# Patient Record
Sex: Female | Born: 1975 | Race: White | Hispanic: No | Marital: Married | Smoking: Current every day smoker
Health system: Southern US, Community
[De-identification: ages and names within clinical notes are randomized; demographics above are authoritative.]

## PROBLEM LIST (undated history)

## (undated) DIAGNOSIS — M25469 Effusion, unspecified knee: Secondary | ICD-10-CM

## (undated) DIAGNOSIS — M546 Pain in thoracic spine: Secondary | ICD-10-CM

## (undated) DIAGNOSIS — T8859XA Other complications of anesthesia, initial encounter: Secondary | ICD-10-CM

## (undated) DIAGNOSIS — L501 Idiopathic urticaria: Secondary | ICD-10-CM

## (undated) DIAGNOSIS — J45909 Unspecified asthma, uncomplicated: Secondary | ICD-10-CM

## (undated) HISTORY — PX: AUGMENTATION MAMMAPLASTY: SUR837

## (undated) HISTORY — PX: TUBAL LIGATION: SHX77

## (undated) HISTORY — PX: ABDOMINAL HYSTERECTOMY: SHX81

---

## 2006-02-22 ENCOUNTER — Emergency Department: Payer: Self-pay | Admitting: Emergency Medicine

## 2006-02-23 ENCOUNTER — Ambulatory Visit: Payer: Self-pay | Admitting: Emergency Medicine

## 2007-01-12 ENCOUNTER — Emergency Department: Payer: Self-pay | Admitting: Emergency Medicine

## 2007-08-15 ENCOUNTER — Emergency Department: Payer: Self-pay | Admitting: Emergency Medicine

## 2007-10-26 ENCOUNTER — Emergency Department: Payer: Self-pay | Admitting: Emergency Medicine

## 2011-03-20 ENCOUNTER — Emergency Department: Payer: Self-pay | Admitting: *Deleted

## 2013-05-31 ENCOUNTER — Emergency Department: Payer: Self-pay | Admitting: Emergency Medicine

## 2014-08-20 ENCOUNTER — Ambulatory Visit: Payer: Self-pay | Admitting: Obstetrics and Gynecology

## 2014-08-28 ENCOUNTER — Ambulatory Visit
Admit: 2014-08-28 | Disposition: A | Discharge status: Home / Self Care | Payer: Self-pay | Attending: Obstetrics and Gynecology | Admitting: Obstetrics and Gynecology

## 2014-08-28 LAB — BASIC METABOLIC PANEL
ANION GAP: 8 (ref 7–16)
BUN: 13 mg/dL
CALCIUM: 8.1 mg/dL — AB
CO2: 21 mmol/L — AB
CREATININE: 0.89 mg/dL
Chloride: 102 mmol/L
Glucose: 158 mg/dL — ABNORMAL HIGH
Potassium: 4.1 mmol/L
Sodium: 131 mmol/L — ABNORMAL LOW

## 2014-08-28 LAB — CBC
HCT: 36.1 % (ref 35.0–47.0)
HGB: 12.3 g/dL (ref 12.0–16.0)
MCH: 30.9 pg (ref 26.0–34.0)
MCHC: 34 g/dL (ref 32.0–36.0)
MCV: 91 fL (ref 80–100)
Platelet: 208 10*3/uL (ref 150–440)
RBC: 3.97 10*6/uL (ref 3.80–5.20)
RDW: 12.5 % (ref 11.5–14.5)
WBC: 11.9 10*3/uL — ABNORMAL HIGH (ref 3.6–11.0)

## 2014-08-29 LAB — BASIC METABOLIC PANEL
ANION GAP: 7 (ref 7–16)
BUN: 10 mg/dL
CO2: 26 mmol/L
CREATININE: 0.77 mg/dL
Calcium, Total: 8.4 mg/dL — ABNORMAL LOW
Chloride: 104 mmol/L
GLUCOSE: 132 mg/dL — AB
Potassium: 4.2 mmol/L
Sodium: 137 mmol/L

## 2014-08-29 LAB — CBC
HCT: 32.6 % — ABNORMAL LOW (ref 35.0–47.0)
HGB: 10.8 g/dL — AB (ref 12.0–16.0)
MCH: 30.1 pg (ref 26.0–34.0)
MCHC: 33 g/dL (ref 32.0–36.0)
MCV: 91 fL (ref 80–100)
PLATELETS: 201 10*3/uL (ref 150–440)
RBC: 3.58 10*6/uL — ABNORMAL LOW (ref 3.80–5.20)
RDW: 12.3 % (ref 11.5–14.5)
WBC: 11.5 10*3/uL — AB (ref 3.6–11.0)

## 2014-09-22 LAB — SURGICAL PATHOLOGY

## 2014-09-28 NOTE — Op Note (Signed)
PATIENT NAME:  Paula Petersen, Paula Petersen MR#:  [id number] DATE OF BIRTH:  17-Aug-1975  DATE OF PROCEDURE:  08/28/2014  PREOPERATIVE DIAGNOSES:  1.  Stress urinary incontinence.  2.  Midline cystocele, grade 2.  3.  Chronic pelvic pain.  4.  Adenomyosis.  POSTOPERATIVE DIAGNOSES:   1.  Stress urinary incontinence.  2.  Midline cystocele, grade 2.  3.  Chronic pelvic pain.  4.  Adenomyosis.  PROCEDURES:  1.  Total vaginal hysterectomy.  2.  Anterior colporrhaphy.  3.  Retropubic tension-free vaginal tape sling.  4.  Cystoscopy.   ANESTHESIA: General.   SURGEON: Conard Novak, MD  ASSISTANT: McPherson Bing, MD  ESTIMATED BLOOD LOSS: 400 mL   OPERATIVE FLUIDS: Crystalloid 1700 mL.   COMPLICATIONS: None.   SPECIMENS: Uterus and cervix.   CONDITION AT THE END OF PROCEDURE: Stable.   PROCEDURE IN DETAIL: The patient was taken to the operating room where a formal timeout was performed. General anesthesia was induced. She was then prepped and draped in the usual sterile fashion and carefully placed in high lithotomy position in candy cane stirrups. Great care was taken to reduce and minimize risk to vulnerable nerves and blood vessels. Exam under anesthesia was performed and revealed the findings as described above. An indwelling catheter was placed in the bladder. The cervix was grasped with a double-tooth tenaculum and circumferentially injected with approximately 10 mL of 0.25% Marcaine with epinephrine for a liquid tourniquet. The cervix was then circumferentially scored with a Bovie. The posterior cul-de-sacs were entered sharply without incident. Deaver was used to displace the bladder as much as possible out of the operative field and right angle retractor was placed to displace the rectum.   Using successive clamp, cut, and tie technique, the uterosacral and cardinal ligaments were transected and suture ligated with 0 Vicryl stitches. The anterior cul-de-sac was eventually entered  after dissecting carefully the bladder off the anterior portion of the uterus and lower uterine segment and cervix with noted adhesive disease. Verification that no damage to the bladder was performed. Uterine vessels and the remaining parametrial tissues were similarly clamped, cut, and tied to the level of the utero-ovarian ligaments bilaterally. The utero-ovarian ligaments secured, transected, ligated with 0 Vicryl sutures and using an initial tie on a passer for the first stitch and the second stitch was a Heaney fixation stitch to ensure hemostasis. The uterus was then removed and handed off the operative field. All pedicles were reinspected and found to be hemostatic.   The anterior vaginal epithelium was then grasped with Allis clamps at the level of the vaginal cuff and a midline incision was made using Metzenbaum scissors and the epithelium was dissected up off the vaginal muscularis. Prior this, the tissue was injected with a dilute solution of vasopressin with 20 units of vasopressin in 40 mL of normal saline for approximately 10 mL in the anterior vaginal wall with good blanching of the tissue.   A weighted speculum was placed in the vagina and the bladder was drained and the Foley catheter was removed. Allis clamps were then placed on either side of the mid urethra and 10 mL were infiltrated on either side of the urethra with a dilute solution of 20 units of vasopressin in 40 mL of normal saline. The mid urethral incision was made and the bilateral periurethral spaces were developed with Metzenbaum scissors. With the bladder empty and urethra deflected to the contralateral side, the TVT needle was advanced through the endopelvic fascia,  space of Retzius, and the abdominal wall to the premarked spot. The same procedure was then carried out on the contralateral side. Care was taken to avoid bladder injury and cystoscopy with the trocars in place showed the absence of bladder penetration.   After the  needles had been had been drawn through the abdominal wall and the tape was adjusted so that it rested underneath the midportion of the urethra in a tension-free manner, the plastic sleeves were carefully removed while the suburethral portion of the sling was stabilized. Good hemostasis was achieved at the vaginal and suprapubic incision sites. The suprapubic incisions were closed with 4-0 Monocryl and Dermabond and approximately 10 mL of 0.25% Marcaine was injected into the suprapubic area.   At this point, the epithelium had already been dissected off the vaginal muscularis on the anterior wall of the cystocele. Once this was completed, the midline structures were then plicated using 2-0 PDS sutures. An appropriate amount of epithelium was trimmed and the vaginal wall including the opening for the TVT was closed with running 3-0 Vicryl sutures with good hemostasis. Note, that there separate incisions for the TVT vaginal wall opening as well as the anterior colporrhaphy opening. This was closed down to the level of the vaginal cuff.   The vaginal cuff was closed in a horizontal manner using #0 Vicryl in a running locked fashion with care to incorporate the uterosacral ligaments as well as the vaginal epithelium and the peritoneum on each edge of the cuff. Hemostasis was noted.   Of note, prior to closing the vaginal cuff, all vaginal lap pads were removed and cystoscopy was performed once again and, again, no defects in the bladder were noted and efflux from both ureteral orifices were noted under cystoscopy. The Foley catheter was replaced at this point.    The vagina was packed using a 2 inch vaginal packing with estrogen. Hemostasis was noted. No other instruments were left in the vagina.   The patient tolerated the procedure well. Sponge, lap, and needle counts were correct x 2. For antibiotic prophylaxis, the patient received 2 grams of Ancef prior to the initial incision. For VTE prophylaxis, the  patient was wearing pneumatic compression stockings, which were operating throughout the entire procedure. The patient was awakened in the operating room and taken to the recovery area in stable condition.    ____________________________ Conard NovakStephen D. Jackson, MD sdj:bm D: 09/03/2014 17:51:37 ET T: 09/04/2014 01:37:19 ET JOB#: [id number]  cc: Conard NovakStephen D. Jackson, MD, <Dictator> Conard NovakSTEPHEN D JACKSON MD ELECTRONICALLY SIGNED 09/25/2014 9:47

## 2014-12-23 LAB — COMPREHENSIVE METABOLIC PANEL
ALBUMIN: 4.3 g/dL
ALK PHOS: 42 U/L
AST: 20 U/L
Anion Gap: 8 (ref 7–16)
BUN: 11 mg/dL
Bilirubin,Total: 0.8 mg/dL
CREATININE: 0.81 mg/dL
Calcium, Total: 9.3 mg/dL
Chloride: 105 mmol/L
Co2: 27 mmol/L
EGFR (Non-African Amer.): >60
GLUCOSE: 114 mg/dL — AB
Potassium: 4.1 mmol/L
SGPT (ALT): 19 U/L
SODIUM: 140 mmol/L
TOTAL PROTEIN: 7.2 g/dL

## 2014-12-23 LAB — CBC
HCT: 40.4 % (ref 35.0–47.0)
HGB: 13.3 g/dL (ref 12.0–16.0)
MCH: 30.1 pg (ref 26.0–34.0)
MCHC: 33 g/dL (ref 32.0–36.0)
MCV: 91 fL (ref 80–100)
Platelet: 229 10*3/uL (ref 150–440)
RBC: 4.42 10*6/uL (ref 3.80–5.20)
RDW: 12.6 % (ref 11.5–14.5)
WBC: 6 10*3/uL (ref 3.6–11.0)

## 2019-04-12 ENCOUNTER — Other Ambulatory Visit: Payer: Self-pay | Admitting: Certified Nurse Midwife

## 2019-04-12 DIAGNOSIS — Z01419 Encounter for gynecological examination (general) (routine) without abnormal findings: Secondary | ICD-10-CM

## 2019-04-12 DIAGNOSIS — N6324 Unspecified lump in the left breast, lower inner quadrant: Secondary | ICD-10-CM

## 2019-04-22 ENCOUNTER — Ambulatory Visit
Admission: RE | Admit: 2019-04-22 | Discharge: 2019-04-22 | Disposition: A | Discharge status: Home / Self Care | Payer: BLUE CROSS/BLUE SHIELD | Source: Ambulatory Visit | Attending: Certified Nurse Midwife | Admitting: Certified Nurse Midwife

## 2019-04-22 ENCOUNTER — Other Ambulatory Visit: Payer: Self-pay | Admitting: Certified Nurse Midwife

## 2019-04-22 ENCOUNTER — Encounter (HOSPITAL_COMMUNITY): Payer: Self-pay

## 2019-04-22 DIAGNOSIS — Z01419 Encounter for gynecological examination (general) (routine) without abnormal findings: Secondary | ICD-10-CM | POA: Diagnosis not present

## 2019-04-22 DIAGNOSIS — N6324 Unspecified lump in the left breast, lower inner quadrant: Secondary | ICD-10-CM

## 2020-02-17 DIAGNOSIS — M7541 Impingement syndrome of right shoulder: Secondary | ICD-10-CM | POA: Insufficient documentation

## 2020-03-06 ENCOUNTER — Other Ambulatory Visit: Payer: Self-pay | Admitting: Physician Assistant

## 2020-03-06 ENCOUNTER — Other Ambulatory Visit (HOSPITAL_COMMUNITY): Payer: Self-pay | Admitting: Physician Assistant

## 2020-03-06 DIAGNOSIS — M7541 Impingement syndrome of right shoulder: Secondary | ICD-10-CM

## 2020-03-10 ENCOUNTER — Other Ambulatory Visit: Payer: Self-pay | Admitting: Physician Assistant

## 2020-03-10 DIAGNOSIS — M7541 Impingement syndrome of right shoulder: Secondary | ICD-10-CM

## 2020-03-24 ENCOUNTER — Ambulatory Visit: Payer: BLUE CROSS/BLUE SHIELD

## 2021-03-22 IMAGING — MG MM  DIGITAL DIAGNOSTIC BREAST BILAT IMPLANT W/ TOMO W/ CAD
8 of 19 series · 8 of 39 positions shown · non-contrast
Comparison: No prior studies.

CLINICAL DATA: Patient presents with a palpable lump along the
inferior, medial aspect of the left breast, with associated
tenderness, which she believes has increased in size of the last few
months. She initially noted this approximately 6-7 months ago.

EXAM:
DIGITAL DIAGNOSTIC BILATERAL MAMMOGRAM WITH IMPLANTS, CAD AND TOMO
ULTRASOUND LEFT BREAST
The patient has retropectoral implants. Standard and implant
displaced views were performed.

[R MLO]
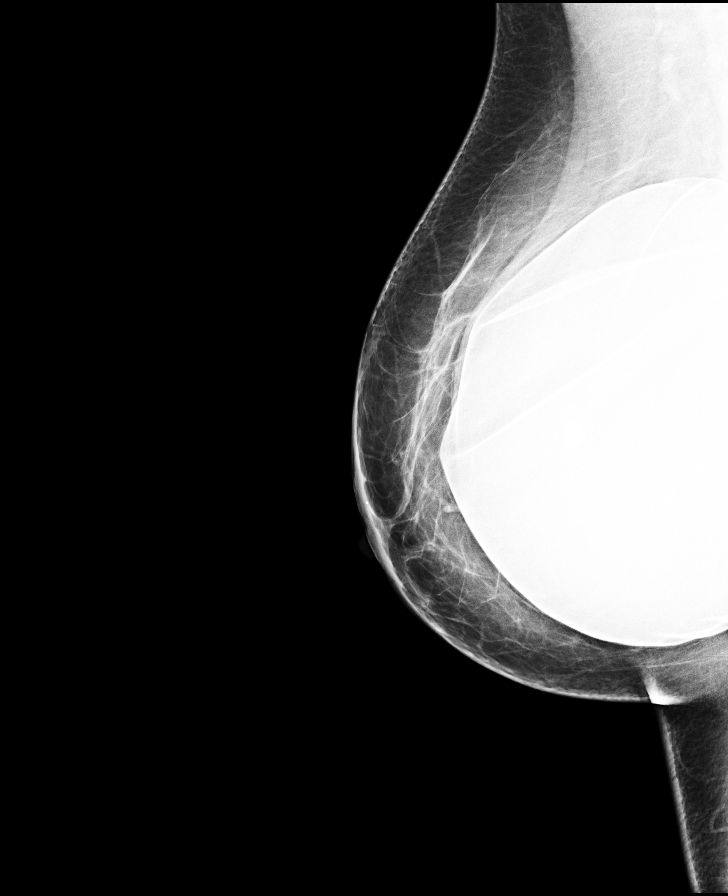

[R CC]
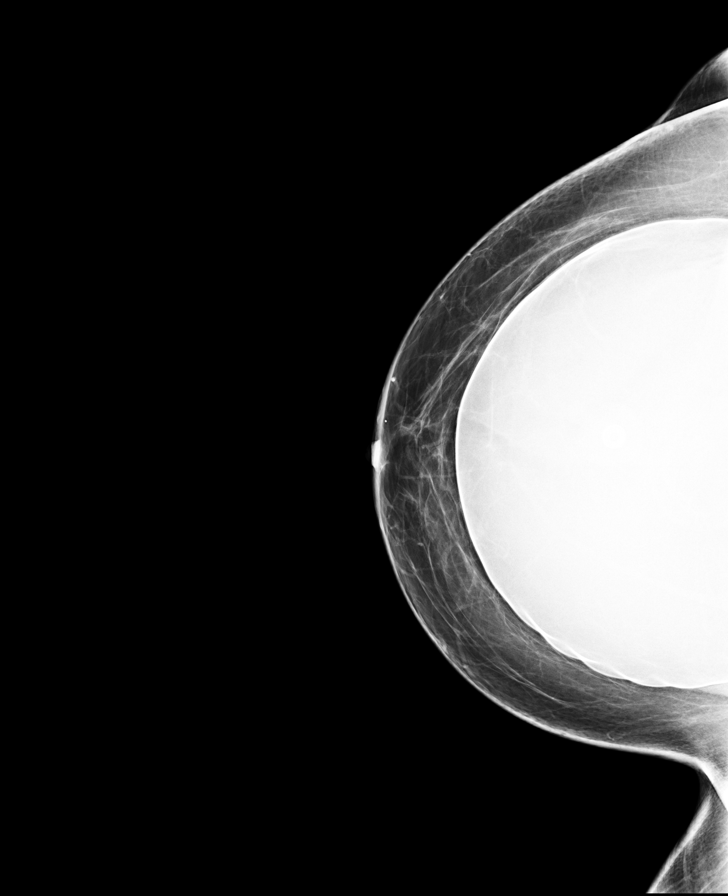

[L CC (1 of 2)]
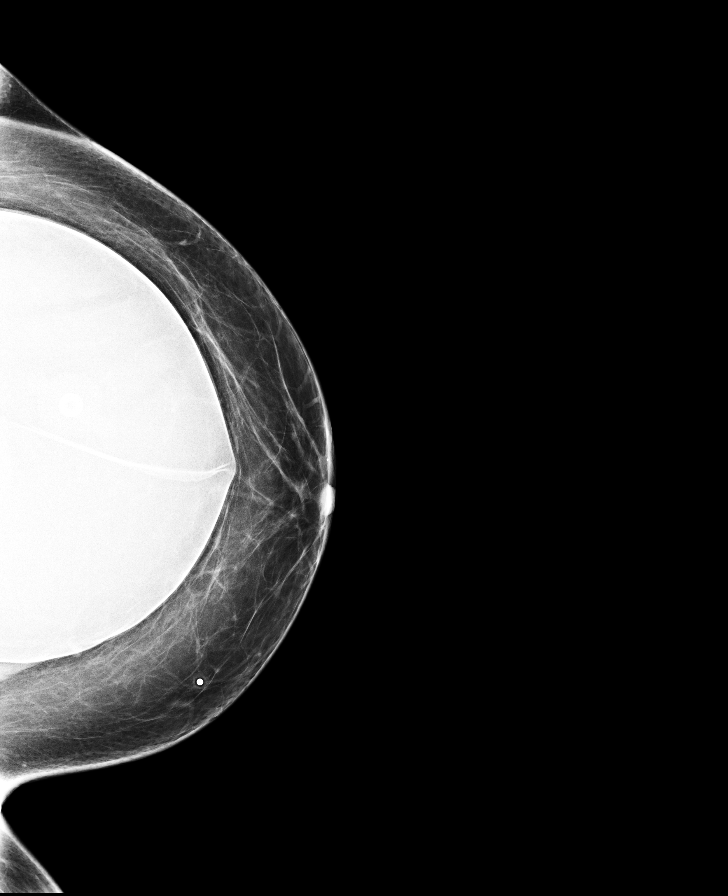

[L MLO]
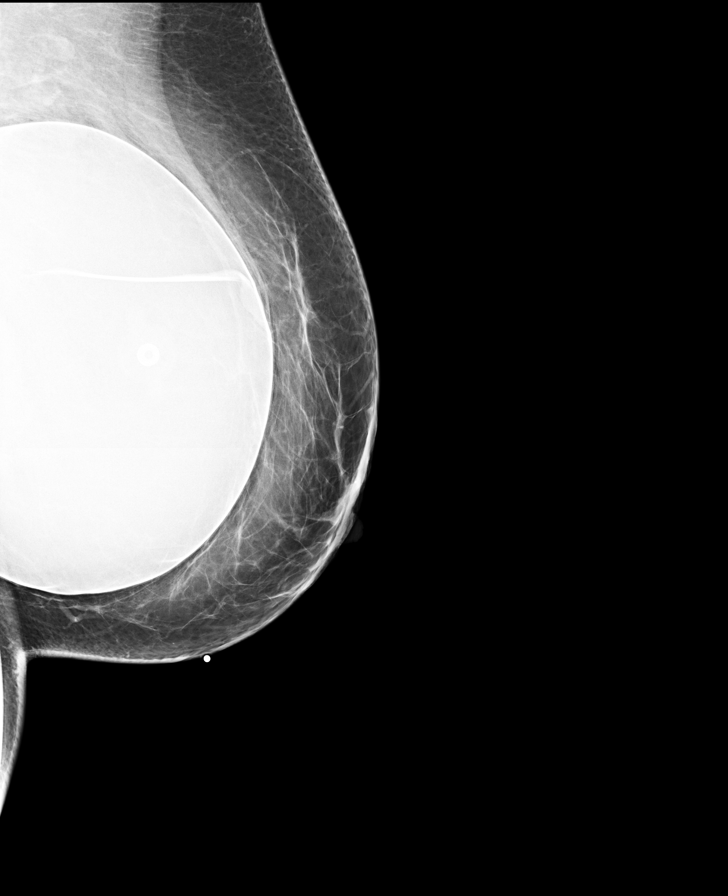

[R MLO synth-2D]
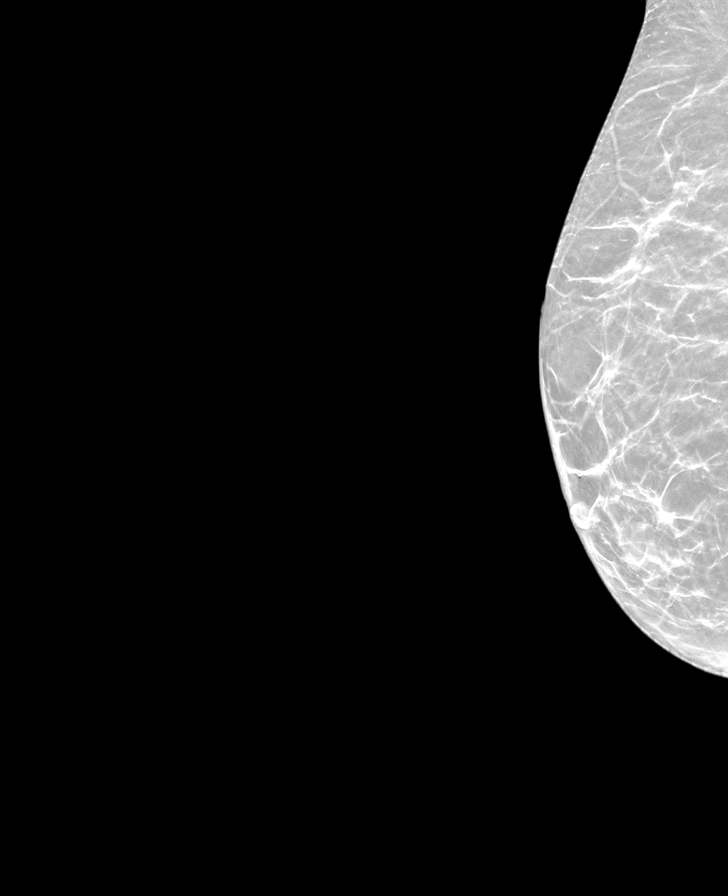

[L TAN synth-2D]
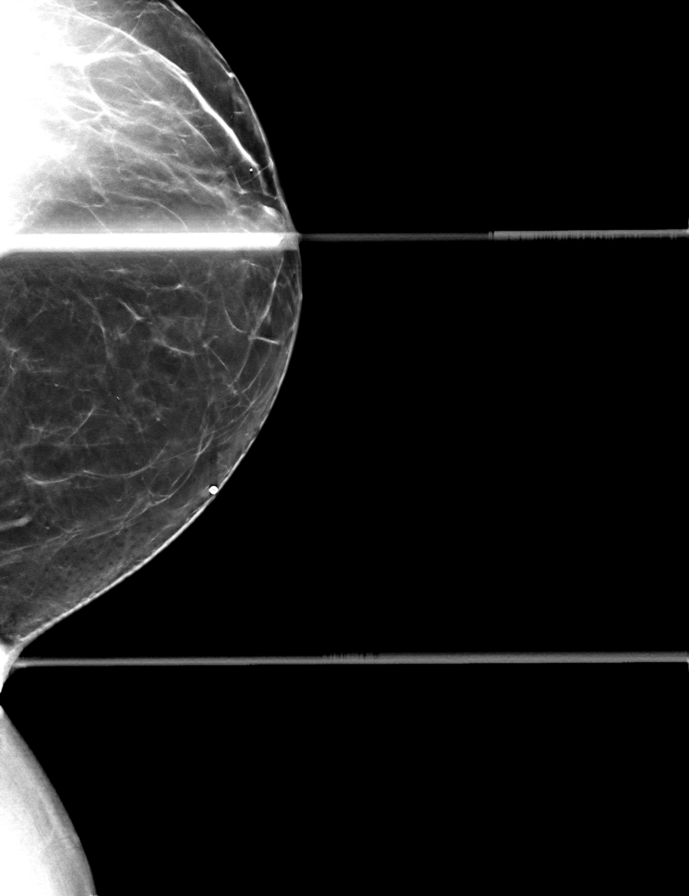

[L MLO synth-2D]
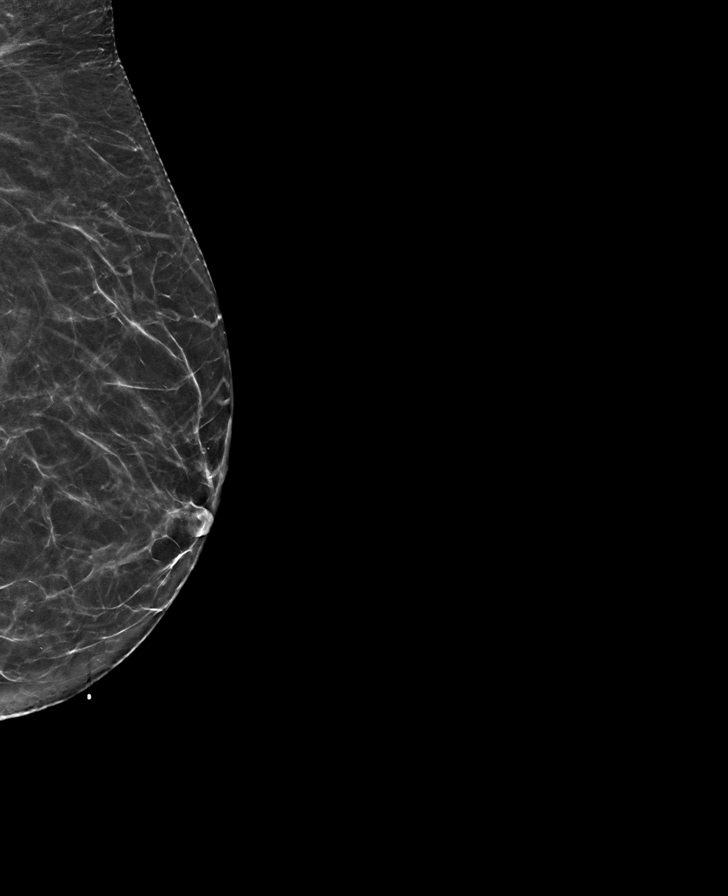

[L CC (2 of 2)]
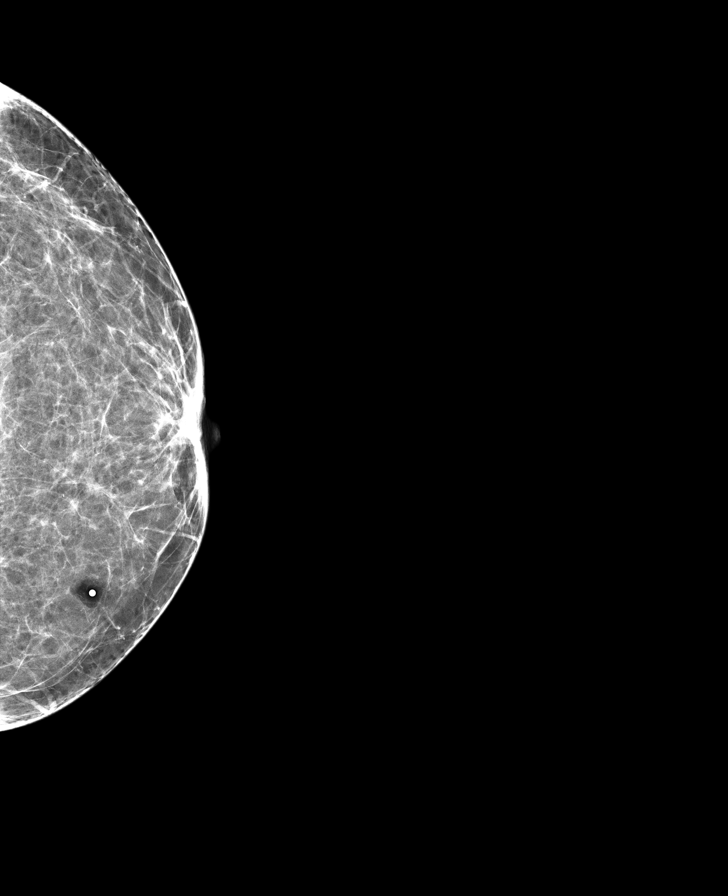

[8 of 39 positions shown; findings below may reference images not displayed]

This is the baseline exam.

ACR Breast Density Category a: The breast tissue is almost entirely
fatty.
FINDINGS: There are no masses, areas of architectural distortion, areas of
significant asymmetry or suspicious calcifications.

On physical exam, there is a smooth small masslike area along the
lower inner quadrant of the left breast just above the inframammary
fold, with associated tenderness to palpation.

Targeted ultrasound is performed, showing normal tissue throughout
the lower inner quadrant of the left breast. There is no mass. The
palpable abnormality corresponds to a normal appearing fat lobule.

Mammographic images were processed with CAD.
IMPRESSION: Negative exam.  No evidence of breast malignancy.

RECOMMENDATION:
1.  Screening mammogram in one year.(Code:[id number])
2. If the palpable abnormality appears to be enlarging, clinical
follow-up would be indicated with possible follow-up breast MRI
without and with contrast.

I have discussed the findings and recommendations with the patient.
If applicable, a reminder letter will be sent to the patient
regarding the next appointment.

BI-RADS CATEGORY  1: Negative.

## 2022-10-25 DIAGNOSIS — G8929 Other chronic pain: Secondary | ICD-10-CM | POA: Insufficient documentation

## 2022-10-26 ENCOUNTER — Other Ambulatory Visit: Payer: Self-pay | Admitting: Family Medicine

## 2022-10-26 DIAGNOSIS — Z1231 Encounter for screening mammogram for malignant neoplasm of breast: Secondary | ICD-10-CM

## 2022-11-24 ENCOUNTER — Other Ambulatory Visit: Payer: Self-pay | Admitting: Family Medicine

## 2022-11-24 DIAGNOSIS — N63 Unspecified lump in unspecified breast: Secondary | ICD-10-CM

## 2022-11-30 ENCOUNTER — Other Ambulatory Visit: Payer: Self-pay | Admitting: Family Medicine

## 2022-11-30 ENCOUNTER — Ambulatory Visit
Admission: RE | Admit: 2022-11-30 | Discharge: 2022-11-30 | Disposition: A | Discharge status: Home / Self Care | Payer: Managed Care, Other (non HMO) | Source: Ambulatory Visit | Attending: Family Medicine | Admitting: Family Medicine

## 2022-11-30 DIAGNOSIS — N63 Unspecified lump in unspecified breast: Secondary | ICD-10-CM | POA: Insufficient documentation

## 2022-11-30 DIAGNOSIS — Z1231 Encounter for screening mammogram for malignant neoplasm of breast: Secondary | ICD-10-CM | POA: Diagnosis not present

## 2022-12-20 DIAGNOSIS — M1711 Unilateral primary osteoarthritis, right knee: Secondary | ICD-10-CM | POA: Insufficient documentation

## 2023-05-31 HISTORY — PX: COLONOSCOPY: SHX174

## 2023-08-01 ENCOUNTER — Other Ambulatory Visit: Payer: Self-pay

## 2023-08-01 ENCOUNTER — Emergency Department
Admission: EM | Admit: 2023-08-01 | Discharge: 2023-08-01 | Disposition: A | Discharge status: Home / Self Care | Attending: Emergency Medicine | Admitting: Emergency Medicine

## 2023-08-01 DIAGNOSIS — R42 Dizziness and giddiness: Secondary | ICD-10-CM | POA: Insufficient documentation

## 2023-08-01 DIAGNOSIS — U071 COVID-19: Secondary | ICD-10-CM | POA: Diagnosis not present

## 2023-08-01 DIAGNOSIS — R509 Fever, unspecified: Secondary | ICD-10-CM | POA: Diagnosis present

## 2023-08-01 LAB — URINALYSIS, ROUTINE W REFLEX MICROSCOPIC
Bilirubin Urine: NEGATIVE
Glucose, UA: NEGATIVE mg/dL
Ketones, ur: NEGATIVE mg/dL
Leukocytes,Ua: NEGATIVE
Nitrite: NEGATIVE
Protein, ur: NEGATIVE mg/dL
Specific Gravity, Urine: 1.009 (ref 1.005–1.030)
pH: 8 (ref 5.0–8.0)

## 2023-08-01 LAB — BASIC METABOLIC PANEL
Anion gap: 9 (ref 5–15)
BUN: 13 mg/dL (ref 6–20)
CO2: 23 mmol/L (ref 22–32)
Calcium: 9 mg/dL (ref 8.9–10.3)
Chloride: 105 mmol/L (ref 98–111)
Creatinine, Ser: 0.74 mg/dL (ref 0.44–1.00)
GFR, Estimated: >60 mL/min (ref >60)
Glucose, Bld: 95 mg/dL (ref 70–99)
Potassium: 4.1 mmol/L (ref 3.5–5.1)
Sodium: 137 mmol/L (ref 135–145)

## 2023-08-01 LAB — CBC
HCT: 41.2 % (ref 36.0–46.0)
Hemoglobin: 14 g/dL (ref 12.0–15.0)
MCH: 31.5 pg (ref 26.0–34.0)
MCHC: 34 g/dL (ref 30.0–36.0)
MCV: 92.8 fL (ref 80.0–100.0)
Platelets: 196 10*3/uL (ref 150–400)
RBC: 4.44 MIL/uL (ref 3.87–5.11)
RDW: 11.9 % (ref 11.5–15.5)
WBC: 4.1 10*3/uL (ref 4.0–10.5)
nRBC: 0 % (ref 0.0–0.2)

## 2023-08-01 MED ORDER — SODIUM CHLORIDE 0.9 % IV BOLUS
1000.0000 mL | Freq: Once | INTRAVENOUS | Status: AC
  Administered 2023-08-01: 1000 mL via INTRAVENOUS

## 2023-08-01 MED ORDER — MECLIZINE HCL 25 MG PO TABS: 25.0000 mg | ORAL_TABLET | Freq: Three times a day (TID) | ORAL | 0 refills | Status: DC | PRN

## 2023-08-01 MED ORDER — MECLIZINE HCL 25 MG PO TABS
25.0000 mg | ORAL_TABLET | Freq: Once | ORAL | Status: AC
  Administered 2023-08-01: 25 mg via ORAL
  Filled 2023-08-01: qty 1

## 2023-08-01 MED ORDER — ONDANSETRON 4 MG PO TBDP: 4.0000 mg | ORAL_TABLET | Freq: Three times a day (TID) | ORAL | 0 refills | Status: DC | PRN

## 2023-08-01 NOTE — ED Triage Notes (Signed)
 First nurse note: pt to ED [facility] for flu sx for few days. +dizziness, has not been eating and drinking. +orthostatic. +COVID. Sent for IVF

## 2023-08-01 NOTE — ED Provider Notes (Signed)
 [facility] Provider Note    Event Date/Time   First MD Initiated Contact with Patient 08/01/23 1123     (approximate)   History   Dizziness   HPI  Paula Petersen is a 48 y.o. female  who presents to the emergency department today with concern for dizziness and lightheadedness in the setting of COVID. Patient is coming from walk in clinic. Has had some fever over the past couple of days. This morning noticed significant dizziness especially with change in position. At walk in clinic they were concerned for possible dehydration. The patient denies any headache.        Physical Exam   Triage Vital Signs: ED Triage Vitals  Encounter Vitals Group     BP 08/01/23 1113 (!) 94/54     Systolic BP Percentile --      Diastolic BP Percentile --      Pulse Rate 08/01/23 1113 61     Resp 08/01/23 1113 20     Temp 08/01/23 1113 98.4 F (36.9 C)     Temp Source 08/01/23 1113 Oral     SpO2 08/01/23 1113 99 %     Weight 08/01/23 1115 155 lb (70.3 kg)     Height 08/01/23 1115 5\' 5"  (1.651 m)     Head Circumference --      Peak Flow --      Pain Score 08/01/23 1111 0     Pain Loc --      Pain Education --      Exclude from Growth Chart --     Most recent vital signs: Vitals:   08/01/23 1113  BP: (!) 94/54  Pulse: 61  Resp: 20  Temp: 98.4 F (36.9 C)  SpO2: 99%   General: Awake, alert, oriented. CV:  Good peripheral perfusion. Regular rate and rhythm. Resp:  Normal effort. Lungs clear. Abd:  No distention.    ED Results / Procedures / Treatments   Labs (all labs ordered are listed, but only abnormal results are displayed) Labs Reviewed  URINALYSIS, ROUTINE W REFLEX MICROSCOPIC - Abnormal; Notable for the following components:      Result Value   Color, Urine YELLOW (*)    APPearance CLEAR (*)    Hgb urine dipstick MODERATE (*)    Bacteria, UA RARE (*)    All other components within normal limits  BASIC METABOLIC PANEL  CBC  CBG  MONITORING, ED     EKG  I, Phineas Semen, attending physician, personally viewed and interpreted this EKG  EKG Time: 1114 Rate: 63 Rhythm: normal sinus rhythm Axis: normal Intervals: qtc 429 QRS: narrow ST changes: no st elevation Impression: normal ekg    RADIOLOGY None   PROCEDURES:  Critical Care performed: No   MEDICATIONS ORDERED IN ED: Medications - No data to display   IMPRESSION / MDM / ASSESSMENT AND PLAN / ED COURSE  I reviewed the triage vital signs and the nursing notes.                              Differential diagnosis includes, but is not limited to, dehydration, electrolyte abnormality, vertigo  Patient's presentation is most consistent with acute presentation with potential threat to life or bodily function.   Patient presented to the emergency department today because of concerns for dizziness in the setting of recent COVID diagnosis.  Patient was sent from walk-in clinic because of concerns  for dehydration and need for IV fluids.  Patient does describe vertiginous type symptoms here.  Patient was given IV fluids as well as meclizine and did feel improvement.  At this time I think is reasonable for patient to be discharged.  Blood work without concerning electrolyte abnormalities or leukocytosis.  Will give prescription for meclizine and Zofran to help with symptoms.     FINAL CLINICAL IMPRESSION(S) / ED DIAGNOSES   Final diagnoses:  COVID-19  Dizziness     Note:  This document was prepared using Dragon voice recognition software and may include unintentional dictation errors.    Phineas Semen, MD 08/01/23 1314

## 2023-08-01 NOTE — ED Triage Notes (Signed)
 Pt to ED with husband for feeling "dizzy, lightheaded and vomiting" since 0630 this morning. 3 episodes emesis so far.   Everything started with sore throat and fever (102) since Sunday.   Sent from [facility] for IVF. Dizziness worse with movement. Diagnosed covid positive today at [facility].

## 2023-10-24 DIAGNOSIS — R0609 Other forms of dyspnea: Secondary | ICD-10-CM | POA: Insufficient documentation

## 2023-10-25 ENCOUNTER — Other Ambulatory Visit: Payer: Self-pay | Admitting: Pulmonary Disease

## 2023-10-25 DIAGNOSIS — J45998 Other asthma: Secondary | ICD-10-CM

## 2023-10-25 DIAGNOSIS — K219 Gastro-esophageal reflux disease without esophagitis: Secondary | ICD-10-CM

## 2023-10-25 DIAGNOSIS — R0609 Other forms of dyspnea: Secondary | ICD-10-CM

## 2023-10-25 DIAGNOSIS — R053 Chronic cough: Secondary | ICD-10-CM

## 2023-10-26 ENCOUNTER — Ambulatory Visit
Admission: RE | Admit: 2023-10-26 | Discharge: 2023-10-26 | Disposition: A | Discharge status: Home / Self Care | Source: Ambulatory Visit | Attending: Pulmonary Disease | Admitting: Pulmonary Disease

## 2023-10-26 DIAGNOSIS — J45998 Other asthma: Secondary | ICD-10-CM

## 2023-10-26 DIAGNOSIS — K219 Gastro-esophageal reflux disease without esophagitis: Secondary | ICD-10-CM

## 2023-10-26 DIAGNOSIS — R053 Chronic cough: Secondary | ICD-10-CM

## 2023-10-26 DIAGNOSIS — R0609 Other forms of dyspnea: Secondary | ICD-10-CM

## 2023-11-30 ENCOUNTER — Other Ambulatory Visit: Payer: Self-pay | Admitting: Otolaryngology

## 2023-11-30 DIAGNOSIS — E041 Nontoxic single thyroid nodule: Secondary | ICD-10-CM

## 2024-01-02 ENCOUNTER — Ambulatory Visit: Payer: Self-pay

## 2024-01-02 DIAGNOSIS — D124 Benign neoplasm of descending colon: Secondary | ICD-10-CM | POA: Diagnosis not present

## 2024-01-02 DIAGNOSIS — Z1211 Encounter for screening for malignant neoplasm of colon: Secondary | ICD-10-CM | POA: Diagnosis present

## 2024-01-02 DIAGNOSIS — K5730 Diverticulosis of large intestine without perforation or abscess without bleeding: Secondary | ICD-10-CM | POA: Diagnosis not present

## 2024-02-21 ENCOUNTER — Other Ambulatory Visit: Payer: Self-pay | Admitting: Orthopedic Surgery

## 2024-02-21 DIAGNOSIS — M2392 Unspecified internal derangement of left knee: Secondary | ICD-10-CM

## 2024-02-21 DIAGNOSIS — G8929 Other chronic pain: Secondary | ICD-10-CM

## 2024-02-21 DIAGNOSIS — M1712 Unilateral primary osteoarthritis, left knee: Secondary | ICD-10-CM

## 2024-02-26 ENCOUNTER — Ambulatory Visit
Admission: RE | Admit: 2024-02-26 | Discharge: 2024-02-26 | Disposition: A | Discharge status: Home / Self Care | Source: Ambulatory Visit | Attending: Orthopedic Surgery | Admitting: Orthopedic Surgery

## 2024-02-26 DIAGNOSIS — M1712 Unilateral primary osteoarthritis, left knee: Secondary | ICD-10-CM

## 2024-02-26 DIAGNOSIS — G8929 Other chronic pain: Secondary | ICD-10-CM

## 2024-02-26 DIAGNOSIS — M2392 Unspecified internal derangement of left knee: Secondary | ICD-10-CM

## 2024-04-30 ENCOUNTER — Ambulatory Visit: Admitting: Dermatology

## 2024-04-30 ENCOUNTER — Encounter: Payer: Self-pay | Admitting: Dermatology

## 2024-04-30 DIAGNOSIS — L302 Cutaneous autosensitization: Secondary | ICD-10-CM

## 2024-04-30 DIAGNOSIS — R21 Rash and other nonspecific skin eruption: Secondary | ICD-10-CM | POA: Diagnosis not present

## 2024-04-30 DIAGNOSIS — Z7189 Other specified counseling: Secondary | ICD-10-CM | POA: Diagnosis not present

## 2024-04-30 DIAGNOSIS — J4530 Mild persistent asthma, uncomplicated: Secondary | ICD-10-CM | POA: Insufficient documentation

## 2024-04-30 DIAGNOSIS — J309 Allergic rhinitis, unspecified: Secondary | ICD-10-CM | POA: Insufficient documentation

## 2024-04-30 DIAGNOSIS — L209 Atopic dermatitis, unspecified: Secondary | ICD-10-CM

## 2024-04-30 DIAGNOSIS — L501 Idiopathic urticaria: Secondary | ICD-10-CM | POA: Insufficient documentation

## 2024-04-30 MED ORDER — DOXYCYCLINE HYCLATE 100 MG PO TABS: 100.0000 mg | ORAL_TABLET | Freq: Two times a day (BID) | ORAL | 0 refills | Status: AC

## 2024-04-30 MED ORDER — TERBINAFINE HCL 250 MG PO TABS: 250.0000 mg | ORAL_TABLET | Freq: Every day | ORAL | 0 refills | Status: DC

## 2024-04-30 NOTE — Patient Instructions (Signed)

## 2024-04-30 NOTE — Progress Notes (Unsigned)
   New Patient Visit   Subjective  Paula Petersen is a 48 y.o. female who presents for the following: Rash  At shoulders, feet and legs, not itchy or burning. Rash has improved at shoulder. Patient has not used anything to treat, present since 04/16/24 and has started to ease off since then. Patient was detained in a [place] prison from 11/16 - 11/21 and reports that it was extremely filthy.  The following portions of the chart were reviewed this encounter and updated as appropriate: medications, allergies, medical history  Review of Systems:  No other skin or systemic complaints except as noted in HPI or Assessment and Plan.  Objective  Well appearing patient in no apparent distress; mood and affect are within normal limits.  A focused examination was performed of the following areas: Feet, legs, arms, trunk  Relevant exam findings are noted in the Assessment and Plan.             Assessment & Plan   RASH   ATOPIC DERMATITIS, UNSPECIFIED TYPE    RASH with autoeczematization Exam: erythematous circular to serpiginous scaly plaques on bilateral plantar, medial, distal dorsal feet. Scattered resolving inflamed papules on trunk  KOH scraping negative for fungi  Ddx tinea pedis with autoeczematization vs irritant contact dermatitis vs less likely leptospirosis, cutaneous larva migrans  Treatment Plan: Take doxycycline 100 mg tablet by mouth twice a day for 14 days. #28, 0 refills  Doxycycline should be taken with food to prevent nausea. Do not lay down for 30 minutes after taking. Be cautious with sun exposure and use good sun protection while on this medication. Pregnant women should not take this medication.   Start terbinafine 1 tablet po (250 mg once daily) for 14 days. #14, 0 refills  Patient advised if she notices new spots/rashes to d/c medication.  R/o infectious before treating with steroid Biopsy if not improving  Terbinafine Counseling  Terbinafine is  an anti-fungal medicine that can be applied to the skin (over the counter) or taken by mouth (prescription) to treat fungal infections. The pill version is often used to treat fungal infections of the nails or scalp. While most people do not have any side effects from taking terbinafine pills, some possible side effects of the medicine can include taste changes, headache, loss of smell, vision changes, nausea, vomiting, or diarrhea.   Rare side effects can include irritation of the liver, allergic reaction, or decrease in blood counts (which may show up as not feeling well or developing an infection). If you are concerned about any of these side effects, please stop the medicine and call your doctor, or in the case of an emergency such as feeling very unwell, seek immediate medical care.      Return in about 2 weeks (around 05/14/2024) for w/ Dr. Claudene.  LILLETTE Lonell Drones, RMA, am acting as scribe for Boneta Claudene, MD .   Documentation: I have reviewed the above documentation for accuracy and completeness, and I agree with the above.  Boneta Claudene, MD

## 2024-05-06 ENCOUNTER — Other Ambulatory Visit: Payer: Self-pay | Admitting: Dermatology

## 2024-05-06 ENCOUNTER — Telehealth: Payer: Self-pay

## 2024-05-06 DIAGNOSIS — B379 Candidiasis, unspecified: Secondary | ICD-10-CM

## 2024-05-06 MED ORDER — FLUCONAZOLE 150 MG PO TABS: 150.0000 mg | ORAL_TABLET | Freq: Once | ORAL | 1 refills | Status: AC

## 2024-05-06 NOTE — Telephone Encounter (Signed)
 Patient called Left message on Voicemail Doxycycline  causing yeast infection, what should she do?

## 2024-05-14 ENCOUNTER — Encounter: Payer: Self-pay | Admitting: Dermatology

## 2024-05-14 ENCOUNTER — Ambulatory Visit (INDEPENDENT_AMBULATORY_CARE_PROVIDER_SITE_OTHER): Admitting: Dermatology

## 2024-05-14 DIAGNOSIS — R21 Rash and other nonspecific skin eruption: Secondary | ICD-10-CM

## 2024-05-14 NOTE — Progress Notes (Signed)
° °  Follow-Up Visit   Subjective  Paula Petersen is a 48 y.o. female who presents for the following: Rash 2 week f/u. -Doxycycline  2x 14 days -Terbinafine  1x 14 days Patient states the medication has been a little helpful. States got upset stomach and threw up a couple times even when taking medication with food. She has noticed some redness and states no change.  The following portions of the chart were reviewed this encounter and updated as appropriate: medications, allergies, medical history  Review of Systems:  No other skin or systemic complaints except as noted in HPI or Assessment and Plan.  Objective  Well appearing patient in no apparent distress; mood and affect are within normal limits.  A focused examination was performed of the following areas: Bilateral feet  Relevant exam findings are noted in the Assessment and Plan.              Bacterial & Fungal culture   Left Medial Heel erythematous circular to serpiginous scaly plaques   Assessment & Plan   RASH with autoeczematization Exam: erythematous circular to serpiginous scaly plaques on bilateral plantar, medial, distal dorsal feet. Scattered resolving inflamed papules on trunk   KOH scraping negative for fungi 04/30/24   Ddx tinea pedis with autoeczematization vs irritant contact dermatitis vs less likely leptospirosis, cutaneous larva migrans   Treatment Plan: H&E, bacterial and fungal biopsies obtained today.   Discussed with patient hold treatment while pending biopsy and culture results.  RASH AND OTHER NONSPECIFIC SKIN ERUPTION Left Medial Heel - Skin / nail biopsy - Left Medial Heel Type of biopsy: tangential   Informed consent: discussed and consent obtained   Timeout: patient name, date of birth, surgical site, and procedure verified   Procedure prep:  Patient was prepped and draped in usual sterile fashion Prep type:  Isopropyl alcohol Anesthesia: the lesion was anesthetized in a  standard fashion   Anesthetic:  1% lidocaine w/ epinephrine 1-100,000 buffered w/ 8.4% NaHCO3 Instrument used: DermaBlade   Hemostasis achieved with: pressure and aluminum chloride   Outcome: patient tolerated procedure well   Post-procedure details: sterile dressing applied and wound care instructions given   Dressing type: bandage and petrolatum    Specimen 1 - Surgical pathology Differential Diagnosis: Ddx tinea pedis with autoeczematization vs irritant contact dermatitis vs less likely leptospirosis, cutaneous larva migrans  Check Margins: No erythematous circular to serpiginous scaly plaques. This Visit - Anaerobic and Aerobic Culture - Culture, Fungus with Smear  Return for Pending biopsy results, w/ Dr. Claudene.  IAlmetta Nora, RMA, am acting as scribe for Boneta Claudene, MD .   Documentation: I have reviewed the above documentation for accuracy and completeness, and I agree with the above.  Boneta Claudene, MD

## 2024-05-14 NOTE — Patient Instructions (Addendum)

## 2024-05-15 ENCOUNTER — Ambulatory Visit: Payer: Self-pay | Admitting: Dermatology

## 2024-05-15 LAB — SURGICAL PATHOLOGY

## 2024-05-16 MED ORDER — CLOBETASOL PROPIONATE 0.05 % EX CREA: 1.0000 | TOPICAL_CREAM | Freq: Two times a day (BID) | CUTANEOUS | 1 refills | Status: AC

## 2024-05-16 NOTE — Telephone Encounter (Signed)
-----   Message from Boneta Sharps, MD sent at 05/15/2024  7:30 PM EST ----- left medial heel :       SPONGIOTIC DERMATITIS, CONSISTENT WITH ALLERGIC CONTACT DERMATITIS, SEE       DESCRIPTION   Plan: please call to share biopsy shows eczema and send clobetasol  cream for BID use on rash on legs and feet until resolved. Avoid applying directly to biopsy sites. Thank you

## 2024-05-16 NOTE — Telephone Encounter (Signed)
 Advised pt of bx results. Clobetasol  sent to [facility] in [place]./sh

## 2024-05-27 ENCOUNTER — Other Ambulatory Visit: Payer: Self-pay | Admitting: Orthopedic Surgery

## 2024-06-03 LAB — TISSUE CULTURE AEROBE/ANAEROBE

## 2024-06-03 LAB — RESULT

## 2024-06-03 LAB — SPECIMEN STATUS REPORT

## 2024-06-03 LAB — ANAEROBIC AND AEROBIC CULTURE

## 2024-06-05 LAB — FUNGUS CULTURE W SMEAR

## 2024-06-10 ENCOUNTER — Inpatient Hospital Stay
Admission: RE | Admit: 2024-06-10 | Discharge: 2024-06-10 | Disposition: A | Source: Ambulatory Visit | Attending: Orthopedic Surgery

## 2024-06-10 ENCOUNTER — Other Ambulatory Visit: Payer: Self-pay

## 2024-06-10 VITALS — BP 106/75 | HR 78 | Temp 98.6°F | Resp 18 | Ht 65.0 in | Wt 177.0 lb

## 2024-06-10 DIAGNOSIS — Z01812 Encounter for preprocedural laboratory examination: Secondary | ICD-10-CM | POA: Insufficient documentation

## 2024-06-10 DIAGNOSIS — Z01818 Encounter for other preprocedural examination: Secondary | ICD-10-CM | POA: Diagnosis present

## 2024-06-10 HISTORY — DX: Other complications of anesthesia, initial encounter: T88.59XA

## 2024-06-10 HISTORY — DX: Pain in thoracic spine: M54.6

## 2024-06-10 HISTORY — DX: Unspecified asthma, uncomplicated: J45.909

## 2024-06-10 HISTORY — DX: Idiopathic urticaria: L50.1

## 2024-06-10 HISTORY — DX: Effusion, unspecified knee: M25.469

## 2024-06-10 LAB — CBC WITH DIFFERENTIAL/PLATELET
Abs Immature Granulocytes: 0.01 K/uL (ref 0.00–0.07)
Basophils Absolute: 0.1 K/uL (ref 0.0–0.1)
Basophils Relative: 1 %
Eosinophils Absolute: 0.1 K/uL (ref 0.0–0.5)
Eosinophils Relative: 2 %
HCT: 41.9 % (ref 36.0–46.0)
Hemoglobin: 14.2 g/dL (ref 12.0–15.0)
Immature Granulocytes: 0 %
Lymphocytes Relative: 33 %
Lymphs Abs: 2 K/uL (ref 0.7–4.0)
MCH: 30.9 pg (ref 26.0–34.0)
MCHC: 33.9 g/dL (ref 30.0–36.0)
MCV: 91.1 fL (ref 80.0–100.0)
Monocytes Absolute: 0.3 K/uL (ref 0.1–1.0)
Monocytes Relative: 5 %
Neutro Abs: 3.4 K/uL (ref 1.7–7.7)
Neutrophils Relative %: 59 %
Platelets: 272 K/uL (ref 150–400)
RBC: 4.6 MIL/uL (ref 3.87–5.11)
RDW: 12 % (ref 11.5–15.5)
WBC: 5.9 K/uL (ref 4.0–10.5)
nRBC: 0 % (ref 0.0–0.2)

## 2024-06-10 LAB — URINALYSIS, ROUTINE W REFLEX MICROSCOPIC
Bacteria, UA: NONE SEEN
Bilirubin Urine: NEGATIVE
Glucose, UA: NEGATIVE mg/dL
Ketones, ur: NEGATIVE mg/dL
Leukocytes,Ua: NEGATIVE
Nitrite: NEGATIVE
Protein, ur: NEGATIVE mg/dL
Specific Gravity, Urine: 1.026 (ref 1.005–1.030)
pH: 5 (ref 5.0–8.0)

## 2024-06-10 LAB — COMPREHENSIVE METABOLIC PANEL WITH GFR
ALT: 10 U/L (ref 0–44)
AST: 15 U/L (ref 15–41)
Albumin: 4.6 g/dL (ref 3.5–5.0)
Alkaline Phosphatase: 51 U/L (ref 38–126)
Anion gap: 8 (ref 5–15)
BUN: 17 mg/dL (ref 6–20)
CO2: 24 mmol/L (ref 22–32)
Calcium: 9.3 mg/dL (ref 8.9–10.3)
Chloride: 105 mmol/L (ref 98–111)
Creatinine, Ser: 0.92 mg/dL (ref 0.44–1.00)
GFR, Estimated: >60 mL/min
Glucose, Bld: 96 mg/dL (ref 70–99)
Potassium: 4.6 mmol/L (ref 3.5–5.1)
Sodium: 137 mmol/L (ref 135–145)
Total Bilirubin: 1.2 mg/dL (ref 0.0–1.2)
Total Protein: 7.3 g/dL (ref 6.5–8.1)

## 2024-06-10 LAB — SURGICAL PCR SCREEN
MRSA, PCR: NEGATIVE
Staphylococcus aureus: NEGATIVE

## 2024-06-10 NOTE — Patient Instructions (Addendum)
 Your procedure is scheduled on:  MONDAY JANUARY 19  Report to the Registration Desk on the 1st floor of the [facility] To find out your arrival time, please call [phone] between 1PM - 3PM on:  FRIDAY  JANUARY 9  If your arrival time is 6:00 am, do not arrive before that time as the [facility] entrance doors do not open until 6:00 am.  REMEMBER: Instructions that are not followed completely may result in serious medical risk, up to and including death; or upon the discretion of your surgeon and anesthesiologist your surgery may need to be rescheduled.  Do not eat food after midnight the night before surgery.  No gum chewing or hard candies.  You may however, drink CLEAR liquids up to 2 hours before you are scheduled to arrive for your surgery. Do not drink anything within 2 hours of your scheduled arrival time.  Clear liquids include: - water  - apple juice without pulp - gatorade (not RED colors) - black coffee or tea (Do NOT add milk or creamers to the coffee or tea) Do NOT drink anything that is not on this list.   In addition, your doctor has ordered for you to drink the provided:  Ensure Pre-Surgery Clear Carbohydrate Drink  Drinking this carbohydrate drink up to two hours before surgery helps to reduce insulin resistance and improve patient outcomes. Please complete drinking 2 hours before scheduled arrival time.  One week prior to surgery:  MONDAY JANUARY 12  Stop Anti-inflammatories (NSAIDS) such as Advil, Aleve, Ibuprofen, Motrin, Naproxen, Naprosyn and Aspirin based products such as Excedrin, Goody's Powder, BC Powder. Stop ANY OVER THE COUNTER supplements until after surgery. cetirizine (ZYRTEC)   You may however, continue to take Tylenol if needed for pain up until the day of surgery.  **Follow guidelines for insulin and diabetes medications.** tirzepatide (ZEPBOUND) hold 7 days prior to surgery , last dose SUNDAY JANUARY 11  meloxicam (MOBIC) hold 7 days  prior to surgery, last dose SUNDAY JANUARY 11  Continue taking all of your other prescription medications up until the day of surgery.  ON THE DAY OF SURGERY DO NOT TAKE ANY MEDICATION    Use inhalers on the day of surgery and bring to the hospital. albuterol (VENTOLIN HFA)  budesonide-formoterol (SYMBICORT)  ipratropium (ATROVENT HFA)   No Alcohol for 24 hours before or after surgery.  No Smoking including e-cigarettes for 24 hours before surgery.   Do not use any recreational drugs for at least a week (preferably 2 weeks) before your surgery.  Please be advised that the combination of cocaine and anesthesia may have negative outcomes, up to and including death. If you test positive for cocaine, your surgery will be cancelled.  On the morning of surgery brush your teeth with toothpaste and water, you may rinse your mouth with mouthwash if you wish. Do not swallow any toothpaste or mouthwash.  Use CHG Soap as directed on instruction sheet.  Do not wear jewelry, make-up, hairpins, clips or nail polish.  For welded (permanent) jewelry: bracelets, anklets, waist bands, etc.  Please have this removed prior to surgery.  If it is not removed, there is a chance that hospital personnel will need to cut it off on the day of surgery.  Do not wear lotions, powders, or perfumes.   Do not shave body hair from the neck down 48 hours before surgery.  Contact lenses, hearing aids and dentures may not be worn into surgery.  Do not bring  valuables to the hospital. [facility] is not responsible for any missing/lost belongings or valuables.   Notify your doctor if there is any change in your medical condition (cold, fever, infection).  Wear comfortable clothing (specific to your surgery type) to the hospital.  After surgery, you can help prevent lung complications by doing breathing exercises.  Take deep breaths and cough every 1-2 hours. Your doctor may order a device called an Incentive  Spirometer to help you take deep breaths.  If you are being admitted to the hospital overnight, leave your suitcase in the car. After surgery it may be brought to your room.  In case of increased patient census, it may be necessary for you, the patient, to continue your postoperative care in the Same Day Surgery department.  If you are being discharged the day of surgery, you will not be allowed to drive home. You will need a responsible individual to drive you home and stay with you for 24 hours after surgery.   If you are taking public transportation, you will need to have a responsible individual with you.  Please call the Pre-admissions Testing Dept. at [phone] if you have any questions about these instructions.  Surgery Visitation Policy:  Patients having surgery or a procedure may have two visitors.  Children under the age of 3 must have an adult with them who is not the patient.  Inpatient Visitation:    Visiting hours are 7 a.m. to 8 p.m. Up to four visitors are allowed at one time in a patient room. The visitors may rotate out with other people during the day.  One visitor age 18 or older may stay with the patient overnight and must be in the room by 8 p.m.   Merchandiser, Retail to address health-related social needs:  [email or web address]       Pre-operative 4 CHG Bath Instructions   You can play a key role in reducing the risk of infection after surgery. Your skin needs to be as free of germs as possible. You can reduce the number of germs on your skin by washing with CHG (chlorhexidine gluconate) soap before surgery. CHG is an antiseptic soap that kills germs and continues to kill germs even after washing.   DO NOT use if you have an allergy to chlorhexidine/CHG or antibacterial soaps. If your skin becomes reddened or irritated, stop using the CHG and notify one of our RNs at [phone].   Please shower with the CHG soap starting 4 days  before surgery using the following schedule:  STARTING THURSDAY  JANUARY 15     Please keep in mind the following:  DO NOT shave, including legs and underarms, starting the day of your first shower.   You may shave your face at any point before/day of surgery.  Place clean sheets on your bed the day you start using CHG soap. Use a clean washcloth (not used since being washed) for each shower. DO NOT sleep with pets once you start using the CHG.   CHG Shower Instructions:  If you choose to wash your hair and private area, wash first with your normal shampoo/soap.  After you use shampoo/soap, rinse your hair and body thoroughly to remove shampoo/soap residue.  Turn the water OFF and apply about 3 tablespoons (45 ml) of CHG soap to a CLEAN washcloth.  Apply CHG soap ONLY FROM YOUR NECK DOWN TO YOUR TOES (washing for 3-5 minutes)  DO NOT use CHG soap on face, private areas,  open wounds, or sores.  Pay special attention to the area where your surgery is being performed.  If you are having back surgery, having someone wash your back for you may be helpful. Wait 2 minutes after CHG soap is applied, then you may rinse off the CHG soap.  Pat dry with a clean towel  Put on clean clothes/pajamas   If you choose to wear lotion, please use ONLY the CHG-compatible lotions on the back of this paper.     Additional instructions for the day of surgery: DO NOT APPLY any lotions, deodorants, cologne, or perfumes.   Put on clean/comfortable clothes.  Brush your teeth.  Ask your nurse before applying any prescription medications to the skin.      CHG Compatible Lotions   Aveeno Moisturizing lotion  Cetaphil Moisturizing Cream  Cetaphil Moisturizing Lotion  Clairol Herbal Essence Moisturizing Lotion, Dry Skin  Clairol Herbal Essence Moisturizing Lotion, Extra Dry Skin  Clairol Herbal Essence Moisturizing Lotion, Normal Skin  Curel Age Defying Therapeutic Moisturizing Lotion with Alpha Hydroxy   Curel Extreme Care Body Lotion  Curel Soothing Hands Moisturizing Hand Lotion  Curel Therapeutic Moisturizing Cream, Fragrance-Free  Curel Therapeutic Moisturizing Lotion, Fragrance-Free  Curel Therapeutic Moisturizing Lotion, Original Formula  Eucerin Daily Replenishing Lotion  Eucerin Dry Skin Therapy Plus Alpha Hydroxy Crme  Eucerin Dry Skin Therapy Plus Alpha Hydroxy Lotion  Eucerin Original Crme  Eucerin Original Lotion  Eucerin Plus Crme Eucerin Plus Lotion  Eucerin TriLipid Replenishing Lotion  Keri Anti-Bacterial Hand Lotion  Keri Deep Conditioning Original Lotion Dry Skin Formula Softly Scented  Keri Deep Conditioning Original Lotion, Fragrance Free Sensitive Skin Formula  Keri Lotion Fast Absorbing Fragrance Free Sensitive Skin Formula  Keri Lotion Fast Absorbing Softly Scented Dry Skin Formula  Keri Original Lotion  Keri Skin Renewal Lotion Keri Silky Smooth Lotion  Keri Silky Smooth Sensitive Skin Lotion  Nivea Body Creamy Conditioning Oil  Nivea Body Extra Enriched Lotion  Nivea Body Original Lotion  Nivea Body Sheer Moisturizing Lotion Nivea Crme  Nivea Skin Firming Lotion  NutraDerm 30 Skin Lotion  NutraDerm Skin Lotion  NutraDerm Therapeutic Skin Cream  NutraDerm Therapeutic Skin Lotion  ProShield Protective Hand Cream  Provon moisturizing lotion       How to Use an Incentive Spirometer  An incentive spirometer is a tool that measures how well you are filling your lungs with each breath. Learning to take long, deep breaths using this tool can help you keep your lungs clear and active. This may help to reverse or lessen your chance of developing breathing (pulmonary) problems, especially infection. You may be asked to use a spirometer: After a surgery. If you have a lung problem or a history of smoking. After a long period of time when you have been unable to move or be active. If the spirometer includes an indicator to show the highest number that you  have reached, your health care provider or respiratory therapist will help you set a goal. Keep a log of your progress as told by your health care provider. What are the risks? Breathing too quickly may cause dizziness or cause you to pass out. Take your time so you do not get dizzy or light-headed. If you are in pain, you may need to take pain medicine before doing incentive spirometry. It is harder to take a deep breath if you are having pain. How to use your incentive spirometer  Sit up on the edge of your bed or on a  chair. Hold the incentive spirometer so that it is in an upright position. Before you use the spirometer, breathe out normally. Place the mouthpiece in your mouth. Make sure your lips are closed tightly around it. Breathe in slowly and as deeply as you can through your mouth, causing the piston or the ball to rise toward the top of the chamber. Hold your breath for 3-5 seconds, or for as long as possible. If the spirometer includes a coach indicator, use this to guide you in breathing. Slow down your breathing if the indicator goes above the marked areas. Remove the mouthpiece from your mouth and breathe out normally. The piston or ball will return to the bottom of the chamber. Rest for a few seconds, then repeat the steps 10 or more times. Take your time and take a few normal breaths between deep breaths so that you do not get dizzy or light-headed. Do this every 1-2 hours when you are awake. If the spirometer includes a goal marker to show the highest number you have reached (best effort), use this as a goal to work toward during each repetition. After each set of 10 deep breaths, cough a few times. This will help to make sure that your lungs are clear. If you have an incision on your chest or abdomen from surgery, place a pillow or a rolled-up towel firmly against the incision when you cough. This can help to reduce pain while taking deep breaths and coughing. General  tips When you are able to get out of bed: Walk around often. Continue to take deep breaths and cough in order to clear your lungs. Keep using the incentive spirometer until your health care provider says it is okay to stop using it. If you have been in the hospital, you may be told to keep using the spirometer at home. Contact a health care provider if: You are having difficulty using the spirometer. You have trouble using the spirometer as often as instructed. Your pain medicine is not giving enough relief for you to use the spirometer as told. You have a fever. Get help right away if: You develop shortness of breath. You develop a cough with bloody mucus from the lungs. You have fluid or blood coming from an incision site after you cough. Summary An incentive spirometer is a tool that can help you learn to take long, deep breaths to keep your lungs clear and active. You may be asked to use a spirometer after a surgery, if you have a lung problem or a history of smoking, or if you have been inactive for a long period of time. Use your incentive spirometer as instructed every 1-2 hours while you are awake. If you have an incision on your chest or abdomen, place a pillow or a rolled-up towel firmly against your incision when you cough. This will help to reduce pain. Get help right away if you have shortness of breath, you cough up bloody mucus, or blood comes from your incision when you cough. This information is not intended to replace advice given to you by your health care provider. Make sure you discuss any questions you have with your health care provider. Document Revised: 08/05/2019 Document Reviewed: 08/05/2019 Elsevier Patient Education  2023 Elsevier Inc.                  Preoperative Educational Videos for Total Hip, Knee and Shoulder Replacements  To better prepare for surgery, please view our videos that explain the  physical activity and discharge planning  required to have the best surgical recovery at [facility].  [email or web address]  Questions? Call [phone] or email [email or web address]

## 2024-06-10 NOTE — Progress Notes (Signed)
 [facility] has been contacted and the EKG that was done 04/29/2024 in their office has been requested. The receptionist stated she was sending the request at this time. Fax number given was [phone].

## 2024-06-10 NOTE — Telephone Encounter (Signed)
 Patient advised cultures did not grow anything. She is having full knee replacement next Monday and will reach out to us  to schedule once she gets her PT schedule to try and coincide visits. Lonell RAMAN., RMA

## 2024-06-10 NOTE — Telephone Encounter (Signed)
-----   Message from Boneta Sharps, MD sent at 06/09/2024  7:15 PM EST ----- Please call to share that bacterial and fungal cultures from rash did not grow anything. Please make follow up for recheck in 1-2 weeks

## 2024-06-17 ENCOUNTER — Ambulatory Visit: Admitting: Anesthesiology

## 2024-06-17 ENCOUNTER — Encounter: Payer: Self-pay | Admitting: Orthopedic Surgery

## 2024-06-17 ENCOUNTER — Encounter: Admission: RE | Disposition: A | Payer: Self-pay | Source: Ambulatory Visit | Attending: Orthopedic Surgery

## 2024-06-17 ENCOUNTER — Other Ambulatory Visit: Payer: Self-pay

## 2024-06-17 ENCOUNTER — Ambulatory Visit

## 2024-06-17 ENCOUNTER — Ambulatory Visit
Admission: RE | Admit: 2024-06-17 | Discharge: 2024-06-18 | Disposition: A | Source: Ambulatory Visit | Attending: Orthopedic Surgery | Admitting: Orthopedic Surgery

## 2024-06-17 DIAGNOSIS — Z87891 Personal history of nicotine dependence: Secondary | ICD-10-CM | POA: Insufficient documentation

## 2024-06-17 DIAGNOSIS — M1712 Unilateral primary osteoarthritis, left knee: Secondary | ICD-10-CM | POA: Diagnosis present

## 2024-06-17 DIAGNOSIS — J45909 Unspecified asthma, uncomplicated: Secondary | ICD-10-CM | POA: Insufficient documentation

## 2024-06-17 DIAGNOSIS — Z96652 Presence of left artificial knee joint: Secondary | ICD-10-CM

## 2024-06-17 DIAGNOSIS — K219 Gastro-esophageal reflux disease without esophagitis: Secondary | ICD-10-CM | POA: Insufficient documentation

## 2024-06-17 HISTORY — PX: TOTAL KNEE ARTHROPLASTY: SHX125

## 2024-06-17 MED ORDER — MIDAZOLAM HCL 5 MG/5ML IJ SOLN
INTRAMUSCULAR | Status: DC | PRN
  Administered 2024-06-17 (×2): 2 mg via INTRAVENOUS

## 2024-06-17 MED ORDER — TRANEXAMIC ACID-NACL 1000-0.7 MG/100ML-% IV SOLN
1000.0000 mg | INTRAVENOUS | Status: AC
  Administered 2024-06-17 (×2): 1000 mg via INTRAVENOUS

## 2024-06-17 MED ORDER — ONDANSETRON HCL 4 MG/2ML IJ SOLN
INTRAMUSCULAR | Status: DC | PRN
  Administered 2024-06-17: 4 mg via INTRAVENOUS

## 2024-06-17 MED ORDER — DOCUSATE SODIUM 100 MG PO CAPS
100.0000 mg | ORAL_CAPSULE | Freq: Two times a day (BID) | ORAL | Status: DC
  Administered 2024-06-17 – 2024-06-18 (×2): 100 mg via ORAL
  Filled 2024-06-17 (×2): qty 1

## 2024-06-17 MED ORDER — BUPIVACAINE LIPOSOME 1.3 % IJ SUSP
INTRAMUSCULAR | Status: AC
  Filled 2024-06-17: qty 20

## 2024-06-17 MED ORDER — ALBUTEROL SULFATE (2.5 MG/3ML) 0.083% IN NEBU: 2.5000 mg | INHALATION_SOLUTION | Freq: Four times a day (QID) | RESPIRATORY_TRACT | Status: DC | PRN

## 2024-06-17 MED ORDER — BUPIVACAINE HCL (PF) 0.5 % IJ SOLN
INTRAMUSCULAR | Status: DC | PRN
  Administered 2024-06-17: 2.7 mL

## 2024-06-17 MED ORDER — CEFAZOLIN SODIUM-DEXTROSE 2-4 GM/100ML-% IV SOLN
INTRAVENOUS | Status: AC
  Filled 2024-06-17: qty 100

## 2024-06-17 MED ORDER — FENTANYL CITRATE (PF) 100 MCG/2ML IJ SOLN
INTRAMUSCULAR | Status: DC | PRN
  Administered 2024-06-17 (×4): 25 ug via INTRAVENOUS

## 2024-06-17 MED ORDER — ONDANSETRON HCL 4 MG/2ML IJ SOLN
INTRAMUSCULAR | Status: AC
  Filled 2024-06-17: qty 2

## 2024-06-17 MED ORDER — LIDOCAINE HCL (PF) 2 % IJ SOLN
INTRAMUSCULAR | Status: AC
  Filled 2024-06-17: qty 5

## 2024-06-17 MED ORDER — SODIUM CHLORIDE (PF) 0.9 % IJ SOLN
INTRAMUSCULAR | Status: DC | PRN
  Administered 2024-06-17: 71 mL

## 2024-06-17 MED ORDER — FENTANYL CITRATE (PF) 100 MCG/2ML IJ SOLN: 25.0000 ug | INTRAMUSCULAR | Status: DC | PRN

## 2024-06-17 MED ORDER — PROPOFOL 10 MG/ML IV BOLUS
INTRAVENOUS | Status: AC
  Filled 2024-06-17: qty 20

## 2024-06-17 MED ORDER — ENOXAPARIN SODIUM 30 MG/0.3ML IJ SOSY
30.0000 mg | PREFILLED_SYRINGE | Freq: Two times a day (BID) | INTRAMUSCULAR | Status: DC
  Administered 2024-06-18: 30 mg via SUBCUTANEOUS
  Filled 2024-06-17: qty 0.3

## 2024-06-17 MED ORDER — ACETAMINOPHEN 500 MG PO TABS
1000.0000 mg | ORAL_TABLET | Freq: Three times a day (TID) | ORAL | Status: DC
  Administered 2024-06-17 – 2024-06-18 (×3): 1000 mg via ORAL
  Filled 2024-06-17 (×2): qty 2

## 2024-06-17 MED ORDER — DEXAMETHASONE SOD PHOSPHATE PF 10 MG/ML IJ SOLN
8.0000 mg | Freq: Once | INTRAMUSCULAR | Status: AC
  Administered 2024-06-17: 8 mg via INTRAVENOUS

## 2024-06-17 MED ORDER — PROPOFOL 10 MG/ML IV BOLUS
INTRAVENOUS | Status: DC | PRN
  Administered 2024-06-17: 20 mg via INTRAVENOUS

## 2024-06-17 MED ORDER — PANTOPRAZOLE SODIUM 40 MG PO TBEC
40.0000 mg | DELAYED_RELEASE_TABLET | Freq: Every day | ORAL | Status: DC
  Administered 2024-06-17 – 2024-06-18 (×2): 40 mg via ORAL
  Filled 2024-06-17 (×2): qty 1

## 2024-06-17 MED ORDER — LIDOCAINE HCL (CARDIAC) PF 100 MG/5ML IV SOSY
PREFILLED_SYRINGE | INTRAVENOUS | Status: DC | PRN
  Administered 2024-06-17: 60 mg via INTRAVENOUS

## 2024-06-17 MED ORDER — PROPOFOL 1000 MG/100ML IV EMUL
INTRAVENOUS | Status: AC
  Filled 2024-06-17: qty 100

## 2024-06-17 MED ORDER — SODIUM CHLORIDE 0.9 % IR SOLN
Status: DC | PRN
  Administered 2024-06-17: 3000 mL

## 2024-06-17 MED ORDER — HYDROCODONE-ACETAMINOPHEN 5-325 MG PO TABS
1.0000 | ORAL_TABLET | ORAL | Status: DC | PRN
  Administered 2024-06-17: 2 via ORAL
  Filled 2024-06-17: qty 2

## 2024-06-17 MED ORDER — LACTATED RINGERS IV SOLN: INTRAVENOUS | Status: DC

## 2024-06-17 MED ORDER — DEXAMETHASONE SOD PHOSPHATE PF 10 MG/ML IJ SOLN
INTRAMUSCULAR | Status: AC
  Filled 2024-06-17: qty 1

## 2024-06-17 MED ORDER — POLYVINYL ALCOHOL 1.4 % OP SOLN: 1.0000 [drp] | Freq: Three times a day (TID) | OPHTHALMIC | Status: DC | PRN

## 2024-06-17 MED ORDER — PHENOL 1.4 % MT LIQD: 1.0000 | OROMUCOSAL | Status: DC | PRN

## 2024-06-17 MED ORDER — SODIUM CHLORIDE (PF) 0.9 % IJ SOLN
INTRAMUSCULAR | Status: AC
  Filled 2024-06-17: qty 20

## 2024-06-17 MED ORDER — ORAL CARE MOUTH RINSE: 15.0000 mL | Freq: Once | OROMUCOSAL | Status: AC

## 2024-06-17 MED ORDER — MENTHOL 3 MG MT LOZG: 1.0000 | LOZENGE | OROMUCOSAL | Status: DC | PRN

## 2024-06-17 MED ORDER — MIDAZOLAM HCL 2 MG/2ML IJ SOLN
INTRAMUSCULAR | Status: AC
  Filled 2024-06-17: qty 2

## 2024-06-17 MED ORDER — ACETAMINOPHEN 500 MG PO TABS
ORAL_TABLET | ORAL | Status: AC
  Filled 2024-06-17: qty 2

## 2024-06-17 MED ORDER — CHLORHEXIDINE GLUCONATE 0.12 % MT SOLN
OROMUCOSAL | Status: AC
  Filled 2024-06-17: qty 15

## 2024-06-17 MED ORDER — METOCLOPRAMIDE HCL 5 MG PO TABS: 5.0000 mg | ORAL_TABLET | Freq: Three times a day (TID) | ORAL | Status: DC | PRN

## 2024-06-17 MED ORDER — PROPOFOL 500 MG/50ML IV EMUL
INTRAVENOUS | Status: DC | PRN
  Administered 2024-06-17: 60 ug/kg/min via INTRAVENOUS

## 2024-06-17 MED ORDER — FENTANYL CITRATE (PF) 100 MCG/2ML IJ SOLN
INTRAMUSCULAR | Status: AC
  Filled 2024-06-17: qty 2

## 2024-06-17 MED ORDER — FLUTICASONE FUROATE-VILANTEROL 200-25 MCG/ACT IN AEPB
1.0000 | INHALATION_SPRAY | Freq: Every day | RESPIRATORY_TRACT | Status: DC
  Filled 2024-06-17: qty 28

## 2024-06-17 MED ORDER — SURGIPHOR WOUND IRRIGATION SYSTEM - OPTIME: TOPICAL | Status: DC | PRN

## 2024-06-17 MED ORDER — CEFAZOLIN SODIUM-DEXTROSE 2-4 GM/100ML-% IV SOLN
2.0000 g | INTRAVENOUS | Status: AC
  Administered 2024-06-17: 2 g via INTRAVENOUS

## 2024-06-17 MED ORDER — BUPIVACAINE-EPINEPHRINE (PF) 0.25% -1:200000 IJ SOLN
INTRAMUSCULAR | Status: AC
  Filled 2024-06-17: qty 30

## 2024-06-17 MED ORDER — METOCLOPRAMIDE HCL 5 MG/ML IJ SOLN: 5.0000 mg | Freq: Three times a day (TID) | INTRAMUSCULAR | Status: DC | PRN

## 2024-06-17 MED ORDER — BUPIVACAINE HCL (PF) 0.5 % IJ SOLN
INTRAMUSCULAR | Status: AC
  Filled 2024-06-17: qty 10

## 2024-06-17 MED ORDER — TRANEXAMIC ACID-NACL 1000-0.7 MG/100ML-% IV SOLN
INTRAVENOUS | Status: AC
  Filled 2024-06-17: qty 100

## 2024-06-17 MED ORDER — SODIUM CHLORIDE 0.9 % IV SOLN: INTRAVENOUS | Status: DC

## 2024-06-17 MED ORDER — ONDANSETRON HCL 4 MG/2ML IJ SOLN
4.0000 mg | Freq: Four times a day (QID) | INTRAMUSCULAR | Status: DC | PRN
  Administered 2024-06-17: 4 mg via INTRAVENOUS
  Filled 2024-06-17: qty 2

## 2024-06-17 MED ORDER — EPHEDRINE SULFATE-NACL 50-0.9 MG/10ML-% IV SOSY
PREFILLED_SYRINGE | INTRAVENOUS | Status: DC | PRN
  Administered 2024-06-17 (×5): 5 mg via INTRAVENOUS

## 2024-06-17 MED ORDER — BUTALBITAL-APAP-CAFFEINE 50-325-40 MG PO TABS
1.0000 | ORAL_TABLET | Freq: Four times a day (QID) | ORAL | Status: DC | PRN
  Administered 2024-06-17: 1 via ORAL
  Filled 2024-06-17 (×3): qty 1

## 2024-06-17 MED ORDER — CHLORHEXIDINE GLUCONATE 0.12 % MT SOLN
15.0000 mL | Freq: Once | OROMUCOSAL | Status: AC
  Administered 2024-06-17: 15 mL via OROMUCOSAL

## 2024-06-17 MED ORDER — ONDANSETRON HCL 4 MG PO TABS: 4.0000 mg | ORAL_TABLET | Freq: Four times a day (QID) | ORAL | Status: DC | PRN

## 2024-06-17 MED ORDER — KETOROLAC TROMETHAMINE 15 MG/ML IJ SOLN
INTRAMUSCULAR | Status: AC
  Filled 2024-06-17: qty 1

## 2024-06-17 MED ORDER — PHENYLEPHRINE 80 MCG/ML (10ML) SYRINGE FOR IV PUSH (FOR BLOOD PRESSURE SUPPORT)
PREFILLED_SYRINGE | INTRAVENOUS | Status: DC | PRN
  Administered 2024-06-17: 160 ug via INTRAVENOUS
  Administered 2024-06-17 (×2): 80 ug via INTRAVENOUS

## 2024-06-17 MED ORDER — TRAMADOL HCL 50 MG PO TABS: 50.0000 mg | ORAL_TABLET | Freq: Four times a day (QID) | ORAL | Status: DC | PRN

## 2024-06-17 MED ORDER — CEFAZOLIN SODIUM-DEXTROSE 2-4 GM/100ML-% IV SOLN
2.0000 g | Freq: Four times a day (QID) | INTRAVENOUS | Status: AC
  Administered 2024-06-17 (×2): 2 g via INTRAVENOUS
  Filled 2024-06-17: qty 100

## 2024-06-17 MED ORDER — ACETAMINOPHEN 325 MG PO TABS: 325.0000 mg | ORAL_TABLET | Freq: Four times a day (QID) | ORAL | Status: DC | PRN

## 2024-06-17 MED ORDER — KETOROLAC TROMETHAMINE 15 MG/ML IJ SOLN
7.5000 mg | Freq: Four times a day (QID) | INTRAMUSCULAR | Status: AC
  Administered 2024-06-17 – 2024-06-18 (×4): 7.5 mg via INTRAVENOUS
  Filled 2024-06-17 (×3): qty 1

## 2024-06-17 MED ORDER — MORPHINE SULFATE (PF) 4 MG/ML IV SOLN
0.5000 mg | INTRAVENOUS | Status: DC | PRN
  Filled 2024-06-17: qty 1

## 2024-06-17 NOTE — Transfer of Care (Signed)
 Immediate Anesthesia Transfer of Care Note  Patient: Paula Petersen  Procedure(s) Performed: ARTHROPLASTY, KNEE, TOTAL (Left: Knee)  Patient Location: PACU  Anesthesia Type:Spinal  Level of Consciousness: awake and alert   Airway & Oxygen Therapy: Patient Spontanous Breathing and Patient connected to nasal cannula oxygen  Post-op Assessment: Post -op Vital signs reviewed and stable  Post vital signs: Reviewed and stable  Last Vitals:  Vitals Value Taken Time  BP 128/80 06/17/24 14:28  Temp 36.5 C 06/17/24 14:28  Pulse 68 06/17/24 14:30  Resp 18 06/17/24 14:30  SpO2 96 % 06/17/24 14:30  Vitals shown include unfiled device data.  Last Pain:  Vitals:   06/17/24 0850  TempSrc: Temporal  PainSc: 6          Complications: No notable events documented.

## 2024-06-17 NOTE — Interval H&P Note (Signed)
 Patient history and physical updated. Consent reviewed including risks, benefits, and alternatives to surgery. Patient agrees with above plan to proceed with left total knee arthroplasty.

## 2024-06-17 NOTE — Anesthesia Preprocedure Evaluation (Signed)
"                                    Anesthesia Evaluation  Patient identified by MRN, date of birth, ID band Patient awake    Reviewed: Allergy & Precautions, H&P , NPO status , Patient's Chart, lab work & pertinent test results, reviewed documented beta blocker date and time   History of Anesthesia Complications (+) PROLONGED EMERGENCE and history of anesthetic complications  Airway Mallampati: I  TM Distance: >3 FB Neck ROM: full    Dental  (+) Dental Advidsory Given, Caps, Missing, Teeth Intact   Pulmonary neg shortness of breath, asthma , neg sleep apnea, neg COPD, neg recent URI, former smoker   Pulmonary exam normal breath sounds clear to auscultation       Cardiovascular Exercise Tolerance: Good negative cardio ROS Normal cardiovascular exam Rhythm:regular Rate:Normal     Neuro/Psych negative neurological ROS  negative psych ROS   GI/Hepatic Neg liver ROS,GERD  Medicated and Controlled,,  Endo/Other  negative endocrine ROS    Renal/GU negative Renal ROS  negative genitourinary   Musculoskeletal   Abdominal   Peds  Hematology negative hematology ROS (+)   Anesthesia Other Findings Past Medical History: No date: Acute thoracic back pain No date: Asthma No date: Complication of anesthesia No date: Idiopathic urticaria No date: Swelling of knee joint   Reproductive/Obstetrics negative OB ROS                              Anesthesia Physical Anesthesia Plan  ASA: 2  Anesthesia Plan: Spinal   Post-op Pain Management:    Induction:   PONV Risk Score and Plan: 2 and Propofol  infusion, TIVA and Treatment may vary due to age or medical condition  Airway Management Planned: Natural Airway and Simple Face Mask  Additional Equipment:   Intra-op Plan:   Post-operative Plan:   Informed Consent: I have reviewed the patients History and Physical, chart, labs and discussed the procedure including the risks,  benefits and alternatives for the proposed anesthesia with the patient or authorized representative who has indicated his/her understanding and acceptance.     Dental Advisory Given  Plan Discussed with: Anesthesiologist, CRNA and Surgeon  Anesthesia Plan Comments:          Anesthesia Quick Evaluation  "

## 2024-06-17 NOTE — Anesthesia Procedure Notes (Signed)
 Spinal  Patient location during procedure: OR Start time: 06/17/2024 12:40 PM End time: 06/17/2024 12:40 PM Reason for block: surgical anesthesia  Staffing Performed: resident/CRNA  Authorized by: Dario Barter, MD   Performed by: Niki Manus SAUNDERS, CRNA  Preanesthetic Checklist Completed: patient identified, IV checked, site marked, risks and benefits discussed, surgical consent, monitors and equipment checked, pre-op evaluation and timeout performed Spinal Block Patient position: sitting Prep: Betadine Patient monitoring: heart rate, continuous pulse ox, blood pressure and cardiac monitor Approach: midline Location: L4-5 Injection technique: single-shot Needle Needle type: Introducer and Pencan  Needle gauge: 25 G Needle length: 10 cm Assessment Sensory level: T6 Events: CSF return  Additional Notes Negative paresthesia. Negative blood return. Positive free-flowing CSF. Expiration date of kit checked and confirmed. Patient tolerated procedure well, without complications.

## 2024-06-17 NOTE — Progress Notes (Signed)
 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 MD ordered - Fioricet 

## 2024-06-17 NOTE — Plan of Care (Signed)
" °  Problem: Education: Goal: Knowledge of the prescribed therapeutic regimen will improve Outcome: Progressing   Problem: Bowel/Gastric: Goal: Gastrointestinal status for postoperative course will improve Outcome: Progressing   Problem: Cardiac: Goal: Ability to maintain an adequate cardiac output Outcome: Progressing Goal: Will show no evidence of cardiac arrhythmias Outcome: Progressing   Problem: Nutritional: Goal: Will attain and maintain optimal nutritional status Outcome: Progressing   Problem: Neurological: Goal: Will regain or maintain usual level of consciousness Outcome: Progressing   Problem: Clinical Measurements: Goal: Ability to maintain clinical measurements within normal limits Outcome: Progressing Goal: Postoperative complications will be avoided or minimized Outcome: Progressing   Problem: Respiratory: Goal: Will regain and/or maintain adequate ventilation Outcome: Progressing Goal: Respiratory status will improve Outcome: Progressing   Problem: Skin Integrity: Goal: Demonstrates signs of wound healing without infection Outcome: Progressing   Problem: Urinary Elimination: Goal: Will remain free from infection Outcome: Progressing Goal: Ability to achieve and maintain adequate urine output Outcome: Progressing   Problem: Education: Goal: Knowledge of the prescribed therapeutic regimen will improve Outcome: Progressing Goal: Individualized Educational Video(s) Outcome: Progressing   Problem: Activity: Goal: Ability to avoid complications of mobility impairment will improve Outcome: Progressing Goal: Range of joint motion will improve Outcome: Progressing   Problem: Clinical Measurements: Goal: Postoperative complications will be avoided or minimized Outcome: Progressing   Problem: Pain Management: Goal: Pain level will decrease with appropriate interventions Outcome: Progressing   Problem: Skin Integrity: Goal: Will show signs of wound  healing Outcome: Progressing   "

## 2024-06-17 NOTE — Discharge Summary (Signed)
 " Physician Discharge Summary  Patient ID: Paula Petersen MRN: [id number] DOB/AGE: [birth year] 49 y.o.  Admit date: 06/17/2024 Discharge date: 06/18/2024  Admission Diagnoses:  Primary osteoarthritis of left knee [M17.12] S/P TKR (total knee replacement), left [Z96.652]  Discharge Diagnoses: Patient Active Problem List   Diagnosis Date Noted   S/P TKR (total knee replacement), left 06/17/2024   Allergic rhinitis 04/30/2024   Idiopathic urticaria 04/30/2024   Mild persistent asthma without complication 04/30/2024   DOE (dyspnea on exertion) 10/24/2023   Arthritis of right knee 12/20/2022   Chronic right shoulder pain 10/25/2022   Impingement syndrome of right shoulder region 02/17/2020    Past Medical History:  Diagnosis Date   Acute thoracic back pain    Asthma    Complication of anesthesia    Idiopathic urticaria    Swelling of knee joint      Transfusion: None.   Consultants (if any):   Discharged Condition: Improved  Hospital Course: TWILLA KHOURI is an 49 y.o. female who was admitted 06/17/2024 with a diagnosis of primary osteoarthritis of the left knee and went to the operating room on 06/17/2024 and underwent the above named procedures.    Surgeries: Procedures: ARTHROPLASTY, KNEE, TOTAL on 06/17/2024 Patient tolerated the surgery well. Taken to PACU where she was stabilized and then transferred to the post-op recovery area.  Started on Lovenox  30mg  q 12 hrs. Heels elevated on bed with rolled towels. No evidence of DVT. Negative Homan. Physical therapy started on day #1 for gait training and transfer. OT started day #1 for ADL and assisted devices.  Patient's IV was removed on POD1.  Implants:  Femur: Persona Size 5 CR PPS   Tibia: Persona Size C OsseoTi  Poly: 10mm MC  Patella: 32x61mm symmetric OsseoTi   She was given perioperative antibiotics:  Anti-infectives (From admission, onward)    Start     Dose/Rate Route Frequency Ordered Stop   06/17/24 1830   ceFAZolin  (ANCEF ) IVPB 2g/100 mL premix        2 g 200 mL/hr over 30 Minutes Intravenous Every 6 hours 06/17/24 1552 06/18/24 0001   06/17/24 0845  ceFAZolin  (ANCEF ) IVPB 2g/100 mL premix        2 g 200 mL/hr over 30 Minutes Intravenous On call to O.R. 06/17/24 9166 06/17/24 1254     .  She was given sequential compression devices, early ambulation, and Lovenox  for DVT prophylaxis.  She benefited maximally from the hospital stay and there were no complications.    Recent vital signs:  Vitals:   06/18/24 0432 06/18/24 0550  BP: (!) 96/50   Pulse: 71 76  Resp: 18   Temp: (!) 97.5 F (36.4 C)   SpO2:  97%    Recent laboratory studies:  Lab Results  Component Value Date   HGB 11.3 (L) 06/18/2024   HGB 14.2 06/10/2024   HGB 14.0 08/01/2023   Lab Results  Component Value Date   WBC 12.3 (H) 06/18/2024   PLT 232 06/18/2024   No results found for: INR Lab Results  Component Value Date   NA 138 06/18/2024   K 4.1 06/18/2024   CL 106 06/18/2024   CO2 23 06/18/2024   BUN 10 06/18/2024   CREATININE 0.70 06/18/2024   GLUCOSE 139 (H) 06/18/2024    Discharge Medications:   Allergies as of 06/18/2024       Reactions   Penicillins Swelling, Nausea Only, Other (See Comments)   Told that had swelling as  a child. Unknown if angioedema.   Other Other (See Comments)   General anesthesia--delayed emergency (difficulty waking)        Medication List     STOP taking these medications    meloxicam 15 MG tablet Commonly known as: MOBIC       TAKE these medications    albuterol  108 (90 Base) MCG/ACT inhaler Commonly known as: VENTOLIN  HFA Inhale 1-2 puffs into the lungs every 6 (six) hours as needed for wheezing or shortness of breath.   budesonide-formoterol 160-4.5 MCG/ACT inhaler Commonly known as: SYMBICORT Inhale 2 puffs into the lungs 2 (two) times daily.   celecoxib  200 MG capsule Commonly known as: CeleBREX  Take 1 capsule (200 mg total) by mouth 2  (two) times daily.   cetirizine 10 MG tablet Commonly known as: ZYRTEC Take 10 mg by mouth at bedtime.   clobetasol  cream 0.05 % Commonly known as: TEMOVATE  Apply 1 Application topically 2 (two) times daily. Bid to aa eczema on legs and feet until resolved, then prn flares, avoid face, groin, axilla What changed:  when to take this additional instructions   cyclobenzaprine 10 MG tablet Commonly known as: FLEXERIL Take 10 mg by mouth daily as needed for muscle spasms.   enoxaparin  40 MG/0.4ML injection Commonly known as: LOVENOX  Inject 0.4 mLs (40 mg total) into the skin daily.   hydrOXYzine 25 MG tablet Commonly known as: ATARAX Take 25 mg by mouth every other day as needed for itching.   ipratropium 17 MCG/ACT inhaler Commonly known as: ATROVENT HFA Inhale 2 puffs into the lungs every 6 (six) hours as needed for wheezing.   Lubricant Eye Drops 0.4-0.3 % Soln Generic drug: Polyethyl Glycol-Propyl Glycol Place 1-2 drops into both eyes 3 (three) times daily as needed (dry/irritated eyes.).   ondansetron  4 MG tablet Commonly known as: ZOFRAN  Take 1 tablet (4 mg total) by mouth every 8 (eight) hours as needed for nausea or vomiting.   oxyCODONE  5 MG immediate release tablet Commonly known as: Roxicodone  Take 0.5-1 tablets (2.5-5 mg total) by mouth every 6 (six) hours as needed for breakthrough pain.   pantoprazole  40 MG tablet Commonly known as: PROTONIX  Take 40 mg by mouth at bedtime.   traMADol  50 MG tablet Commonly known as: ULTRAM  Take 1 tablet (50 mg total) by mouth every 6 (six) hours as needed for moderate pain (pain score 4-6). What changed: reasons to take this   Zepbound 5 MG/0.5ML Pen Generic drug: tirzepatide Inject 5 mg into the skin every Wednesday.               Durable Medical Equipment  (From admission, onward)           Start     Ordered   06/17/24 1719  For home use only DME Bedside commode  Once       Question:  Patient needs a  bedside commode to treat with the following condition  Answer:  S/P TKR (total knee replacement), left   06/17/24 1718   06/17/24 1719  For home use only DME Walker rolling  Once       Question Answer Comment  Walker: With 5 Inch Wheels   Patient needs a walker to treat with the following condition S/P TKR (total knee replacement), left      06/17/24 1718            Diagnostic Studies: DG Knee Left Port Result Date: 06/17/2024 CLINICAL DATA:  Left total knee replacement. EXAM: PORTABLE  LEFT KNEE - 1-2 VIEW COMPARISON:  None Available. FINDINGS: Left total knee arthroplasty. Subcutaneous and joint air and fluid are present. IMPRESSION: Left total knee arthroplasty with expected postoperative findings. Electronically Signed   By: Newell Eke M.D.   On: 06/17/2024 15:34   Disposition: Plan for discharge home today pending progress with PT.     Follow-up Information     Charlene Debby BROCKS, PA-C Follow up in 14 day(s).   Specialties: Orthopedic Surgery, Emergency Medicine Why: Skin check. Contact information: [address] [place] [address] [phone]                Signed: Lynwood LITTIE Level PA-C 06/18/2024, 7:20 AM  "

## 2024-06-17 NOTE — Op Note (Signed)
 Patient Name: Corbyn Steedman  FMW:[id number]  Pre-Operative Diagnosis: Left knee Osteoarthritis  Post-Operative Diagnosis: (same)  Procedure: Left Total Knee Arthroplasty  Components/Implants: Femur: Persona Size 5 CR PPS   Tibia: Persona Size C OsseoTi  Poly: 10mm MC  Patella: 32x68mm symmetric OsseoTi  Femoral Valgus Cut Angle: 5 degrees  Distal Femoral Re-cut: none  Patella Resurfacing: yes   Date of Surgery: 06/17/2024  Surgeon: Arthea Sheer MD  Assistant: [facility] RNFA (present and scrubbed throughout the case, critical for assistance with exposure, retraction, instrumentation, and closure)   Anesthesiologist: Dario  Anesthesia: Spinal   Tourniquet Time: 50 min  EBL: 25cc  IVF: 1400cc  Complications: None   Brief history: The patient is a 49 year old female with a history of osteoarthritis of the left knee with pain limiting their range of motion and activities of daily living, which has failed multiple attempts at conservative therapy.  The risks and benefits of total knee arthroplasty as definitive surgical treatment were discussed with the patient, who opted to proceed with the operation.  After outpatient medical clearance and optimization was completed the patient was admitted to [facility] for the procedure.  All preoperative films were reviewed and an appropriate surgical plan was made prior to surgery. Preoperative range of motion was 0 to 130.   Description of procedure: The patient was brought to the operating room where laterality was confirmed by all those present to be the left side.   Spinal anesthesia was administered and the patient received an intravenous dose of antibiotics for surgical prophylaxis and a dose of tranexamic acid .  Patient is positioned supine on the operating room table with all bony prominences well-padded.  A well-padded tourniquet was applied to the left thigh.  The knee was then prepped and draped in usual  sterile fashion with multiple layers of adhesive and nonadhesive drapes.  All of those present in the operating room participated in a surgical timeout laterality and patient were confirmed.   An Esmarch was wrapped around the extremity and the leg was elevated and the knee flexed.  The tourniquet was inflated to a pressure of 250 mmHg. The Esmarch was removed and the leg was brought down to full extension.  The patella and tibial tubercle identified and outlined using a marking pen and a midline skin incision was made with a knife carried through the subcutaneous tissue down to the extensor retinaculum.  After exposure of the extensor mechanism the medial parapatellar arthrotomy was performed with a scalpel and electrocautery extending down medial and distal to the tibial tubercle taking care to avoid incising the patellar tendon. The knee was examined at this time and found to have full thickness cartilage loss on the ventral patella and the distal femur and proximal tibia with medial meniscal tearing and partial ACL tearing. The decision was made to proceed with total knee arthroplasty.   A standard medial release was performed over the proximal tibia.  The knee was brought into extension in order to excise the fat pad taking care not to damage the patella tendon.  The superior soft tissue was removed from the anterior surface of the distal femur to visualize for the procedure.  The knee was then brought into flexion with the patella subluxed laterally and subluxing the tibia anteriorly.  The ACL was transected and removed with electrocautery and additional soft tissue was removed from the proximal surface of the tibia to fully expose. The PCL was found to be intact and was preserved.  An extramedullary tibial cutting guide was then applied to the leg with a spring-loaded ankle clamp placed around the distal tibia just above the malleoli the angulation of the guide was adjusted to give some posterior slope  in the tibial resection with an appropriate varus/valgus alignment.  The resection guide was then pinned to the proximal tibia and the proximal tibial surface was resected with an oscillating saw.  Careful attention was paid to ensure the blade did not disrupt any of the soft tissues including any lateral or medial ligament.  Attention was then turned to the femur, with the knee slightly flexed a opening drill was used to enter the medullary canal of the femur.  After removing the drill marrow was suctioned out to decompress the distal femur.  An intramedullary femoral guide was then inserted into the drill hole and the alignment guide was seated firmly against the distal end of the medial femoral condyle.  The distal femoral cutting guide was then attached and pinned securely to the anterior surface of the femur and the intramedullary rod and alignment guide was removed.  Distal femur resection was then performed with an oscillating saw with retractors protecting medial and laterally.   The distal cutting block was then removed and the extension gap was checked with a spacer.  Extension gap was found to be appropriately sized to accommodate the spacer block.   The femoral sizing guide was then placed securely into the posterior condyles of the femur and the femoral size was measured and determined to be 5.  The size 5; 4-in-1 cutting guide was placed in position and secured with 2 pins.  The anterior posterior and chamfer resections were then performed with an oscillating saw.  Bony fragments and osteophytes were then removed.  Using a lamina spreader the posterior medial and lateral condyles were checked for additional osteophytes and posterior soft tissue remnants.  Any remaining meniscus was removed at this time.  Periarticular injection was performed in the meniscal rims and posterior capsule with aspiration performed to ensure no intravascular injection.   The tibia was then exposed and the tibial  trial was pinned onto the plateau after confirming appropriate orientation and rotation.  Using the drill bushing the tibia was prepared to the appropriate drill depth.  Tibial broach impactor was then driven through the punch guide using a mallet.  The femoral trial component was then inserted onto the femur. A trial tibial polyethylene bearing was then placed and the knee was reduced.  The knee achieved full extension with no hyperextension and was found to be balanced in flexion and extension with the trials in place.  The knee was then brought into full extension the patella was everted and held with 2 Kocher clamps.  The articular surface of the patella was then resected with an patella reamer and saw after careful measurement with a caliper.  The patella was then prepared with the drill guide and a trial patella was placed.  The knee was then taken through range of motion and it was found that the patella articulated appropriately with the trochlea and good patellofemoral motion without subluxation.    The correct final components for implantation were confirmed and opened by the circulator nurse.  The knee was irrigated with normal saline via pulsatile lavage to remove any bony debris or soft tissue.  The prepared surface of the tibia was exposed and the tibial component was implanted with good bony contact.  The femoral component was then placed and  impacted showing good coverage and a good snug fit.  The patella was then cleared off and the patella compression tool was used to apply the patellar component with symmetric compression onto the patella.  The tibial component was then irrigated and cleared of any debris and a real polyethylene component was placed and engaged with the locking mechanism.  The knee was then injected with the particular cocktail.  The knee was taken through range of motion and found to be stable in flexion and extension with patellar tracking.  The knee was then irrigated with  copious amount of normal saline via pulsatile lavage to remove all loose bodies and other debris.  The knee was then irrigated with surgiphor betadine based wash and reirrigated with saline.  The tourniquet was then dropped and all bleeding vessels were identified and coagulated.  The arthrotomy was approximated with #1 Vicryl and closed with #1 Stratafix suture.  The knee was brought into slight flexion and the subcutaneous tissues were closed with 0 Vicryl, 2-0 Vicryl and a running subcuticular 4-0 stratafix barbed suture.  Skin was then glued with Dermabond.  A sterile adhesive dressing was then placed along with a sequential compression device to the calf, a Ted stocking, and a cryotherapy cuff.   Sponge, needle, and Lap counts were all correct at the end of the case.   The patient was transferred off of the operating room table to a hospital bed, good pulses were found distally on the operative side.  The patient was transferred to the recovery room in stable condition.

## 2024-06-17 NOTE — TOC Initial Note (Signed)
 Transition of Care [facility]) - Initial/Assessment Note    Patient Details  Name: Paula Petersen MRN: [id number] Date of Birth: 1975-09-15  Transition of Care [facility]) CM/SW Contact:    Paula Jackquline RAMAN, RN Phone Number: 06/17/2024, 4:31 PM  Clinical Narrative:                 Patient discharging home with [facility] for PT/OT that was arranged by Dr. Mal office. DME(RW & BSC) ordered via [employer] to be delivered to bedside. Paula Petersen will be picking her up at the time of discharge. No further concerns. RNCM signing off.  Expected Discharge Plan: Home w Home Health Services Barriers to Discharge: Continued Medical Work up   Patient Goals and CMS Choice            Expected Discharge Plan and Services       Living arrangements for the past 2 months: Single Family Home                 DME Arranged: 3-N-1, Walker rolling DME Agency: [employer] Date DME Agency Contacted: 06/17/24   Representative spoke with at DME Agency: Paula Petersen Arranged: PT       Representative spoke with at [facility] Agency: Petersen/PT set up by MD's office prior to surgery with [facility]  Prior Living Arrangements/Services Living arrangements for the past 2 months: Single Family Home Lives with:: Self, Significant Other Patient language and need for interpreter reviewed:: Yes Do you feel safe going back to the place where you live?: Yes      Need for Family Participation in Patient Care: Yes (Comment) Care giver support system in place?: Yes (comment)   Criminal Activity/Legal Involvement Pertinent to Current Situation/Hospitalization: No - Comment as needed  Activities of Daily Living   ADL Screening (condition at time of admission) Independently performs ADLs?: Yes (appropriate for developmental age) Is the patient deaf or have difficulty hearing?: No Does the patient have difficulty seeing, even when wearing glasses/contacts?: No Does the patient have difficulty concentrating, remembering, or making  decisions?: No  Permission Sought/Granted                  Emotional Assessment Appearance:: Appears stated age, Well-Groomed Attitude/Demeanor/Rapport: Gracious, Self-Confident, Engaged Affect (typically observed): Accepting, Calm, Pleasant, Quiet Orientation: : Oriented to Self, Oriented to Place, Oriented to  Time, Oriented to Situation Alcohol  / Substance Use: Not Applicable Psych Involvement: No (comment)  Admission diagnosis:  Primary osteoarthritis of left knee [M17.12] S/P TKR (total knee replacement), left [Z96.652] Patient Active Problem List   Diagnosis Date Noted   S/P TKR (total knee replacement), left 06/17/2024   Allergic rhinitis 04/30/2024   Idiopathic urticaria 04/30/2024   Mild persistent asthma without complication 04/30/2024   DOE (dyspnea on exertion) 10/24/2023   Arthritis of right knee 12/20/2022   Chronic right shoulder pain 10/25/2022   Impingement syndrome of right shoulder region 02/17/2020   PCP:  Paula Margaretann Kay, FNP Pharmacy:   [phone] - [place], [place] - [address] [place] [address] Phone: [phone] Fax: [phone]     Social Drivers of Health (SDOH) Social History: SDOH Screenings   Food Insecurity: No Food Insecurity (06/17/2024)  Housing: Low Risk (06/17/2024)  Transportation Needs: No Transportation Needs (06/17/2024)  Utilities: Not At Risk (06/17/2024)  Financial Resource Strain: Low Risk  (04/05/2024)   Received from [facility]  Tobacco Use: Medium Risk (06/17/2024)   SDOH Interventions:  Readmission Risk Interventions     No data to display

## 2024-06-17 NOTE — Discharge Instructions (Signed)
 Instructions after Total Knee Replacement   Paula Petersen M.D.     Dept. of Orthopaedics & Sports Medicine  [facility]  [address]  [place], [place]  [address]  Phone: [phone]   Fax: [phone]    DIET: Drink plenty of non-alcoholic fluids. Resume your normal diet. Include foods high in fiber.  ACTIVITY:  You may use crutches or a walker with weight-bearing as tolerated, unless instructed otherwise. You may be weaned off of the walker or crutches by your Physical Therapist.  Do NOT place pillows under the knee. Anything placed under the knee could limit your ability to straighten the knee.   Continue doing gentle exercises. Exercising will reduce the pain and swelling, increase motion, and prevent muscle weakness.   Please continue to use the TED compression stockings for 2 weeks. You may remove the stockings at night, but should reapply them in the morning. Do not drive or operate any equipment until instructed.  WOUND CARE:  Continue to use the PolarCare or ice packs periodically to reduce pain and swelling. You may begin showering 3 days after surgery with honeycomb dressing. Remove honeycomb dressing 7 days after surgery and continue showering. Allow dermabond to fall off on its own.  MEDICATIONS: You may resume your regular medications. Please take the pain medication as prescribed on the medication. Do not take pain medication on an empty stomach. You have been given a prescription for a blood thinner (Lovenox or Coumadin). Please take the medication as instructed. (NOTE: After completing a 2 week course of Lovenox, take one 81 mg Enteric-coated aspirin twice a day for 3 additional weeks. This along with elevation will help reduce the possibility of phlebitis in your operated leg.) Do not drive or drink alcoholic beverages when taking pain medications.  POSTOPERATIVE CONSTIPATION PROTOCOL Constipation - defined medically as fewer than three stools per  week and severe constipation as less than one stool per week.  One of the most common issues patients have following surgery is constipation.  Even if you have a regular bowel pattern at home, your normal regimen is likely to be disrupted due to multiple reasons following surgery.  Combination of anesthesia, postoperative narcotics, change in appetite and fluid intake all can affect your bowels.  In order to avoid complications following surgery, here are some recommendations in order to help you during your recovery period.  Colace (docusate) - Pick up an over-the-counter form of Colace or another stool softener and take twice a day as long as you are requiring postoperative pain medications.  Take with a full glass of water daily.  If you experience loose stools or diarrhea, hold the colace until you stool forms back up.  If your symptoms do not get better within 1 week or if they get worse, check with your doctor.  Dulcolax (bisacodyl) - Pick up over-the-counter and take as directed by the product packaging as needed to assist with the movement of your bowels.  Take with a full glass of water.  Use this product as needed if not relieved by Colace only.   MiraLax (polyethylene glycol) - Pick up over-the-counter to have on hand.  MiraLax is a solution that will increase the amount of water in your bowels to assist with bowel movements.  Take as directed and can mix with a glass of water, juice, soda, coffee, or tea.  Take if you go more than two days without a movement. Do not use MiraLax more than once per day.  Call your doctor if you are still constipated or irregular after using this medication for 7 days in a row.  If you continue to have problems with postoperative constipation, please contact the office for further assistance and recommendations.  If you experience "the worst abdominal pain ever" or develop nausea or vomiting, please contact the office immediatly for further recommendations for  treatment.   CALL THE OFFICE FOR: Temperature above 101 degrees Excessive bleeding or drainage on the dressing. Excessive swelling, coldness, or paleness of the toes. Persistent nausea and vomiting.  FOLLOW-UP:  You should have an appointment to return to the office in 14 days after surgery. Arrangements have been made for continuation of Physical Therapy (either home therapy or outpatient therapy).

## 2024-06-17 NOTE — Evaluation (Signed)
 Physical Therapy Evaluation Patient Details Name: Paula Petersen MRN: [id number] DOB: 09/04/1975 Today's Date: 06/17/2024  History of Present Illness  Pt is a 49 yo F diagnosed with L knee OA and is s/p elective L TKA.  PMH includes asthma, GERD, and back pain.  Clinical Impression  Pt was pleasant and motivated to participate during the session and put forth good effort throughout. Pt required no physical assistance with below functional tasks and demonstrated fair to good carryover of proper sequencing.  Pt was generally steady with all standing activities with step-to sequencing training provided initially due to pt's reported significant history of L knee buckling prior to surgery.  Pt was able to march in place and amb with a RW with no noted buckling or instability and with pt reporting that her knee felt very stable and strong subjectively.  Pt reported no adverse symptoms during the session other than her HA that she was pre-medicated for with SpO2 and HR WNL throughout on room air.  Pt will benefit from continued PT services upon discharge to safely address deficits listed in patient problem list for decreased caregiver assistance and eventual return to PLOF.            If plan is discharge home, recommend the following: A little help with walking and/or transfers;A little help with bathing/dressing/bathroom;Assistance with cooking/housework;Assist for transportation;Help with stairs or ramp for entrance   Can travel by private vehicle        Equipment Recommendations Rolling walker (2 wheels);BSC/3in1  Recommendations for Other Services       Functional Status Assessment Patient has had a recent decline in their functional status and demonstrates the ability to make significant improvements in function in a reasonable and predictable amount of time.     Precautions / Restrictions Precautions Precautions: Fall Restrictions Weight Bearing Restrictions Per Provider Order:  Yes LLE Weight Bearing Per Provider Order: Weight bearing as tolerated      Mobility  Bed Mobility Overal bed mobility: Modified Independent             General bed mobility comments: Min extra time and effort only    Transfers Overall transfer level: Needs assistance Equipment used: Rolling walker (2 wheels) Transfers: Sit to/from Stand Sit to Stand: Contact guard assist           General transfer comment: Min verbal and visual cues for sequencing with good eccentric and concentric control and stability    Ambulation/Gait Ambulation/Gait assistance: Contact guard assist Gait Distance (Feet): 12 Feet Assistive device: Rolling walker (2 wheels) Gait Pattern/deviations: Step-to pattern, Decreased step length - right, Decreased stance time - left Gait velocity: decreased     General Gait Details: Step-to pattern education provided initially due to pt's history of L knee buckling; pt able to amb a total of around 15 feet including forwards/backwards with no LOB or buckling noted and with pt stating subjectively that her L knee felt strong and stable under her  Stairs            Wheelchair Mobility     Tilt Bed    Modified Rankin (Stroke Patients Only)       Balance Overall balance assessment: Needs assistance   Sitting balance-Leahy Scale: Normal     Standing balance support: Bilateral upper extremity supported, During functional activity Standing balance-Leahy Scale: Good  Pertinent Vitals/Pain Pain Assessment Pain Assessment: 0-10 Pain Score: 8  Pain Location: HA (no L knee pain) Pain Descriptors / Indicators: Headache Pain Intervention(s): Premedicated before session, Monitored during session    Home Living Family/patient expects to be discharged to:: Private residence Living Arrangements: Spouse/significant other Available Help at Discharge: Family;Available 24 hours/day Type of Home: House Home  Access: Stairs to enter Entrance Stairs-Rails: Left Entrance Stairs-Number of Steps: 5   Home Layout: Two level;Able to live on main level with bedroom/bathroom Home Equipment: [place] - single point;Crutches      Prior Function Prior Level of Function : Independent/Modified Independent             Mobility Comments: Ind amb community distances without an AD, frequent L knee buckling but no falls, very active: plays pickleball multiple hours/day M-F including competitively ADLs Comments: Ind with ADLs     Extremity/Trunk Assessment   Upper Extremity Assessment Upper Extremity Assessment: Overall WFL for tasks assessed    Lower Extremity Assessment Lower Extremity Assessment: LLE deficits/detail LLE Deficits / Details: BLE ankle strength, AROM, and sensation to light touch grossly WNL, able to perform Ind BLE SLRs without extensor lag LLE: Unable to fully assess due to pain LLE Sensation: WNL       Communication   Communication Communication: No apparent difficulties    Cognition Arousal: Alert Behavior During Therapy: WFL for tasks assessed/performed   PT - Cognitive impairments: No apparent impairments                         Following commands: Intact       Cueing Cueing Techniques: Verbal cues, Visual cues     General Comments      Exercises Total Joint Exercises Ankle Circles/Pumps: AROM, Strengthening, Both, 10 reps Quad Sets: AROM, Strengthening, Left, 10 reps Long Arc Quad: AROM, Strengthening, Left, 10 reps Knee Flexion: AROM, Strengthening, Left, 10 reps Goniometric ROM: L knee AROM: 10-95 deg Marching in Standing: AROM, Strengthening, Both, 5 reps, Standing Other Exercises Other Exercises: Positioning education to promote L knee ext PROM and prevent heel pressure Other Exercises: Polar care use education provided Other Exercises: HEP education provided per handout with emphasis on QS and seated knee flex   Assessment/Plan    PT  Assessment Patient needs continued PT services  PT Problem List Decreased strength;Decreased range of motion;Decreased activity tolerance;Decreased balance;Decreased mobility;Decreased knowledge of use of DME       PT Treatment Interventions DME instruction;Gait training;Stair training;Functional mobility training;Therapeutic activities;Therapeutic exercise;Balance training;Patient/family education    PT Goals (Current goals can be found in the Care Plan section)  Acute Rehab PT Goals Patient Stated Goal: To get back to playing pickleball PT Goal Formulation: With patient Time For Goal Achievement: 06/30/24 Potential to Achieve Goals: Good    Frequency BID     Co-evaluation               AM-PAC PT 6 Clicks Mobility  Outcome Measure Help needed turning from your back to your side while in a flat bed without using bedrails?: A Little Help needed moving from lying on your back to sitting on the side of a flat bed without using bedrails?: A Little Help needed moving to and from a bed to a chair (including a wheelchair)?: A Little Help needed standing up from a chair using your arms (e.g., wheelchair or bedside chair)?: A Little Help needed to walk in hospital room?: A Little Help needed climbing  3-5 steps with a railing? : A Little 6 Click Score: 18    End of Session Equipment Utilized During Treatment: Gait belt Activity Tolerance: Patient tolerated treatment well Patient left: in chair;with call bell/phone within reach;with family/visitor present Nurse Communication: Mobility status;Weight bearing status PT Visit Diagnosis: Muscle weakness (generalized) (M62.81);Other abnormalities of gait and mobility (R26.89)    Time: 8371-8295 PT Time Calculation (min) (ACUTE ONLY): 36 min   Charges:   PT Evaluation $PT Eval Moderate Complexity: 1 Mod PT Treatments $Therapeutic Activity: 8-22 mins PT General Charges $$ ACUTE PT VISIT: 1 Visit       D. Glendia Bertin PT,  DPT 06/17/24, 5:37 PM

## 2024-06-17 NOTE — H&P (Signed)
 History of Present Illness: The patient is an 49 y.o. female seen in clinic today for follow-up of ration of her left knee prior to planned left total knee replacement. 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 She has had recent recurrent dislocations of her patella and severe pain over her anterior knee as well as over her medial joint line. She has had been wearing braces and underwent therapy without improvements she takes Tylenol  and meloxicam with some relief but reports 10 out of 10 pain on a nearly daily basis affecting her ability to function and participate in activities. Given the significant cartilage damage in her knee she was referred to me for arthroplasty. She reports the symptoms have not improved since her last visit and she would like to continue to move forward with surgical planning. The patient denies fevers, chills, numbness, tingling, shortness of breath, chest pain, recent illness, or any trauma.  Patient is a non-smoker, nondiabetic with an A1c of 5.6 and a BMI of 28. She plays pickle ball for recreation.  Past Medical History: Past Medical History:  Diagnosis Date  Acute thoracic back pain 11/07/2023  Arthralgia of right knee 12/20/2022  Asthma, unspecified asthma severity, unspecified whether complicated, unspecified whether persistent ([facility])  Idiopathic urticaria 04/29/2024  Swelling of knee joint 12/20/2022   Past Surgical History: Past Surgical History:  Procedure Laterality Date  bladder sling 2015  [email or web address]  01/02/2024  [facility]/Rpt67yrs/KA  CESAREAN SECTION  HYSTERECTOMY  TUBAL LIGATION   Past Family History: Family History  Problem Relation Age of Onset  No Known Problems Mother  No Known Problems Father  No Known Problems Sister  No Known Problems Brother  No Known Problems Maternal Grandmother  No Known Problems Maternal  Grandfather  Cancer Paternal Grandmother  Alzheimer's disease Paternal Grandfather  No Known Problems Brother   Medications: Current Outpatient Medications  Medication Sig Dispense Refill  albuterol  MDI, PROVENTIL , VENTOLIN , PROAIR , HFA 90 mcg/actuation inhaler INHALE 2 INHALATIONS INTO THE LUNGS EVERY 4 (FOUR) HOURS AS NEEDED FOR WHEEZING 6.7 each 11  atovaquone-proguaniL (MALARONE) 250-100 mg tablet Take 1 tablet by mouth once daily 40 tablet 0  budesonide-formoteroL (SYMBICORT) 160-4.5 mcg/actuation inhaler Inhale 2 inhalations into the lungs 2 (two) times daily 10.2 g 3  cetirizine (ZYRTEC) 10 MG tablet take 1 tablet by mouth every day 90 tablet 1  cyanocobalamin (VITAMIN B12) 1000 MCG tablet Take 1,000 mcg by mouth once daily  cyclobenzaprine (FLEXERIL) 10 MG tablet TAKE 1 TABLET BY MOUTH AT BEDTIME AS NEEDED FOR MUSCLE SPASMS. 30 tablet 5  hydrOXYzine (ATARAX) 25 MG tablet Take 1 tablet (25 mg total) by mouth at bedtime as needed 90 tablet 1  ipratropium (ATROVENT HFA) inhaler Inhale 2 inhalations into the lungs 3 (three) times daily 12.9 g 0  meloxicam (MOBIC) 15 MG tablet Take 15 mg by mouth once daily  pantoprazole  (PROTONIX ) 40 MG DR tablet Take 1 tablet (40 mg total) by mouth once daily 30 tablet 3  tirzepatide (ZEPBOUND) 5 mg/0.5 mL pen injector Inject 0.5 mLs (5 mg total) subcutaneously every 7 (seven) days 2 mL 1  traMADoL  (ULTRAM ) 50 mg tablet Take 1 tablet (50 mg total) by mouth every 6 (six) hours as needed for Pain 21 tablet 0   No current facility-administered medications for this visit.   Allergies: Allergies  Allergen Reactions  Penicillins Vomiting  Penicillin Unknown  Visit Vitals: There were no vitals filed for this visit.   Review of Systems:  A comprehensive 14 point ROS was performed, reviewed, and the pertinent orthopaedic findings are documented in the HPI.  Physical Exam: General/Constitutional: No apparent distress: well-nourished and well  developed. Eyes: Pupils equal, round with synchronous movement. Pulmonary exam: Lungs clear to auscultation bilaterally no wheezing rales or rhonchi Cardiac exam: Regular rate and rhythm no obvious murmurs rubs or gallops. Integumentary: No impressive skin lesions present, except as noted in detailed exam. Neuro/Psych: Normal mood and affect, oriented to person, place and time.  Comprehensive Knee Exam: Gait Antalgic on the left  Alignment Neutral   Inspection Right Left  Skin Normal appearance with no obvious deformity. No ecchymosis or erythema. Normal appearance with no obvious deformity. No ecchymosis or erythema.  Soft Tissue No focal soft tissue swelling No focal soft tissue swelling  Quad Atrophy None None   Palpation  Right Left  Tenderness No peripatellar, patellar tendon, quad tendon, medial/lateral joint line pain Medial joint line and medial and lateral parapatellar tenderness to palpation  Crepitus No patellofemoral or tibiofemoral crepitus + patellofemoral crepitus  Effusion None None   Range of Motion Right Left  Flexion 0-130 0-130  Extension Full knee extension without hyperextension Full knee extension without hyperextension   Ligamentous Exam Right Left  Lachman Normal Normal  Valgus 0 Normal Normal  Valgus 30 Normal Normal  Varus 0 Normal Normal  Varus 30 Normal Normal  Anterior Drawer Normal Normal  Posterior Drawer Normal Normal   Meniscal Exam Right Left  Hyperflexion Test Negative Positive  Hyperextension Test Negative Negative  McMurray's Negative Positive   Patellofemoral Exam Right Left  Pain Over Facets No medial/lateral patellar facet pain Medial and lateral patellar facet pain  Moving Patellar Apprehension Negative Positive  J-Sign Negative Negative  Excessive Tilt Negative Negative  Patellar Mobility 2 quadrants medial/lateral 3 quadrants medial/lateral with pain and guarding   Neurovascular Right Left  Quadriceps Strength 5/5 5/5   Hamstring Strength 5/5 5/5  Hip Abductor Strength 4/5 4/5  Distal Motor Normal Normal  Distal Sensory Normal light touch sensation Normal light touch sensation  Distal Pulses Normal Normal    Imaging Studies: I reviewed AP, lateral, sunrise and flexed PA weightbearing x-rays of the left knee performed on 02/19/2024 images reviewed myself. There is some mild early osteophyte formation along the medial femoral condyle medial tibia otherwise medial lateral joint spaces are well-maintained. There is narrowing of the patellofemoral joint with small osteophyte formation. No fractures or dislocations noted  L Knee MRI: 02/26/24: FINDINGS: The anterior cruciate ligament and posterior cruciate ligament are  intact. The medial collateral ligament and lateral collateral ligament are  intact. The menisci are unremarkable. There is a small full-thickness defect to  the lateral tibial articular cartilage. No degenerative edema. 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 Mild to moderate degenerative edema and a small  subchondral cysts. Small joint effusion and small popliteal cyst.   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 Mild to moderate degenerative  edema.   Small full-thickness defect to the lateral tibial articular cartilage without  degenerative edema.   The menisci and ligaments are unremarkable.   Small joint effusion and small popliteal cyst.      I personally reviewed and visualized the aforementioned imaging studies. I additionally personally interpreted any  radiographs taken during today's visit.  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 She also has medial joint line tenderness with  osteophyte formation on x-ray and lateral cartilage damage on MRI. We discussed treatment options including conservative treatments possibility of injections and conservative activity modification for her knee. Given the significant cartilage loss on her ventral patella and severe pain and recurrent dislocations conservative measures are unlikely to improve the stability of her knee. We discussed the possibility of partial and total knee replacement. She would be a good candidate for partial knee replacement except she has significant reproducible pain over her medial joint line on exam today which she reports is about 50% of her pain. Based upon the patient's continued symptoms and failure to respond to conservative treatment, I have recommended a left total knee replacement for this patient. A long discussion took place with the patient describing what a total joint replacement is and what the procedure would entail. A knee model, similar to the implants that will be used during the operation, was utilized to demonstrate the implants. Choices of implant manufactures were discussed and reviewed. The ability to secure the implant utilizing cement or cementless (press fit) fixation was discussed. The approach and exposure was discussed.   The hospitalization and post-operative care and rehabilitation were also discussed. The use of perioperative antibiotics and DVT prophylaxis were discussed. The risk, benefits and alternatives to a surgical intervention were discussed at length with the patient. The patient was also advised of risks related to the medical comorbidities and elevated body mass index (BMI). A lengthy discussion took place to review the most common complications including but not limited to: stiffness, loss of function, complex regional pain syndrome, deep vein thrombosis, pulmonary embolus, heart attack, stroke, infection, wound breakdown, numbness, intraoperative fracture, damage to nerves,  tendon,muscles, arteries or other blood vessels, death and other possible complications from anesthesia. The patient was told that we will take steps to minimize these risks by using sterile technique, antibiotics and DVT prophylaxis when appropriate and follow the patient postoperatively in the office setting to monitor progress. The possibility of recurrent pain, no improvement in pain and actual worsening of pain were also discussed with the patient.   We did specific conversation about her young age and her increased risk of needing a revision during her life.  Patient asked about and confirms no history of any reactions to metal or metal allergy in the past.  The discharge plan of care focused on the patient going home following surgery. The patient was encouraged to make the necessary arrangements to have someone stay with them when they are discharged home.   The benefits of surgery were discussed with the patient including the potential for improving the patient's current clinical condition through operative intervention. Alternatives to surgical intervention including continued conservative management were also discussed in detail. All questions were answered to the satisfaction of the patient. The patient participated and agreed to the plan of care as well as the use of the recommended implants for their total knee replacement surgery. An information packet was given to the patient to review prior to surgery.   Patient received clearance for surgery. All questions answered and patient agrees above plan preparations for a left total knee replacement.   Portions of this record have been created using Scientist, clinical (histocompatibility and immunogenetics). Dictation errors have been sought, but may not have been identified and corrected.  Arthea Sheer MD

## 2024-06-18 ENCOUNTER — Other Ambulatory Visit: Payer: Self-pay

## 2024-06-18 ENCOUNTER — Encounter: Payer: Self-pay | Admitting: Orthopedic Surgery

## 2024-06-18 DIAGNOSIS — M1712 Unilateral primary osteoarthritis, left knee: Secondary | ICD-10-CM | POA: Diagnosis not present

## 2024-06-18 LAB — BASIC METABOLIC PANEL WITH GFR
Anion gap: 9 (ref 5–15)
BUN: 10 mg/dL (ref 6–20)
CO2: 23 mmol/L (ref 22–32)
Calcium: 8.4 mg/dL — ABNORMAL LOW (ref 8.9–10.3)
Chloride: 106 mmol/L (ref 98–111)
Creatinine, Ser: 0.7 mg/dL (ref 0.44–1.00)
GFR, Estimated: >60 mL/min
Glucose, Bld: 139 mg/dL — ABNORMAL HIGH (ref 70–99)
Potassium: 4.1 mmol/L (ref 3.5–5.1)
Sodium: 138 mmol/L (ref 135–145)

## 2024-06-18 LAB — CBC
HCT: 32.5 % — ABNORMAL LOW (ref 36.0–46.0)
Hemoglobin: 11.3 g/dL — ABNORMAL LOW (ref 12.0–15.0)
MCH: 31.2 pg (ref 26.0–34.0)
MCHC: 34.8 g/dL (ref 30.0–36.0)
MCV: 89.8 fL (ref 80.0–100.0)
Platelets: 232 K/uL (ref 150–400)
RBC: 3.62 MIL/uL — ABNORMAL LOW (ref 3.87–5.11)
RDW: 11.7 % (ref 11.5–15.5)
WBC: 12.3 K/uL — ABNORMAL HIGH (ref 4.0–10.5)
nRBC: 0 % (ref 0.0–0.2)

## 2024-06-18 MED ORDER — OXYCODONE HCL 5 MG PO TABS
2.5000 mg | ORAL_TABLET | Freq: Four times a day (QID) | ORAL | 0 refills | Status: AC | PRN
  Filled 2024-06-18: qty 20, 5d supply, fill #0

## 2024-06-18 MED ORDER — ALUM & MAG HYDROXIDE-SIMETH 200-200-20 MG/5ML PO SUSP
15.0000 mL | ORAL | Status: DC | PRN
  Administered 2024-06-18: 15 mL via ORAL
  Filled 2024-06-18: qty 30

## 2024-06-18 MED ORDER — CELECOXIB 200 MG PO CAPS
200.0000 mg | ORAL_CAPSULE | Freq: Two times a day (BID) | ORAL | 0 refills | Status: AC
  Filled 2024-06-18: qty 30, 15d supply, fill #0

## 2024-06-18 MED ORDER — ONDANSETRON HCL 4 MG PO TABS
4.0000 mg | ORAL_TABLET | Freq: Three times a day (TID) | ORAL | 0 refills | Status: AC | PRN
  Filled 2024-06-18: qty 20, 7d supply, fill #0

## 2024-06-18 MED ORDER — TRAMADOL HCL 50 MG PO TABS
50.0000 mg | ORAL_TABLET | Freq: Four times a day (QID) | ORAL | 0 refills | Status: AC | PRN
  Filled 2024-06-18: qty 30, 8d supply, fill #0

## 2024-06-18 MED ORDER — ENOXAPARIN SODIUM 40 MG/0.4ML IJ SOSY
40.0000 mg | PREFILLED_SYRINGE | INTRAMUSCULAR | 0 refills | Status: AC
  Filled 2024-06-18: qty 5.6, 14d supply, fill #0

## 2024-06-18 NOTE — TOC CM/SW Note (Signed)
 Rolling Walker: The beneficiary has a mobility limitation that significantly impairs his/her ability to participate in one or more mobility-related activities of daily living (MRADL) in the home. The patient is able to safely use the walker. The functional mobility deficit can be sufficiently resolved by use of walker.

## 2024-06-18 NOTE — Progress Notes (Signed)
 Patient received 2 doses of Toradol  7.5mg  between 2100 and 2315 due to unrelieved pain.  MD notified

## 2024-06-18 NOTE — Plan of Care (Signed)
  Problem: Clinical Measurements: Goal: Postoperative complications will be avoided or minimized Outcome: Progressing   

## 2024-06-18 NOTE — Progress Notes (Signed)
 DISCHARGE NOTE:  Pt and husband given discharge instructions and verbalized understanding. TED hose on both legs. BSC, walker and beds to meds medications sent with pt. Pt wheeled to car by staff, husband providing transportation home.

## 2024-06-18 NOTE — Progress Notes (Signed)
 Physical Therapy Treatment Patient Details Name: Paula Petersen MRN: [id number] DOB: 1975/10/20 Today's Date: 06/18/2024   History of Present Illness Pt is a 49 yo F diagnosed with L knee OA and is s/p elective L TKA.  PMH includes asthma, GERD, and back pain.    PT Comments  Completed post op TKR session as expected.  Gait, stairs, HEP and general expectations for recovery.  No further questions or concerns noted.   If plan is discharge home, recommend the following: Help with stairs or ramp for entrance;Assist for transportation;Assistance with cooking/housework   Can travel by private vehicle        Equipment Recommendations  Rolling walker (2 wheels);BSC/3in1    Recommendations for Other Services       Precautions / Restrictions Precautions Precautions: Fall Restrictions Weight Bearing Restrictions Per Provider Order: Yes LLE Weight Bearing Per Provider Order: Weight bearing as tolerated     Mobility  Bed Mobility Overal bed mobility: Modified Independent               Patient Response: Cooperative  Transfers Overall transfer level: Modified independent                      Ambulation/Gait Ambulation/Gait assistance: Modified independent (Device/Increase time) Gait Distance (Feet): 200 Feet Assistive device: Rolling walker (2 wheels) Gait Pattern/deviations: Step-to pattern, Decreased step length - right, Decreased stance time - left Gait velocity: decreased     General Gait Details: pt continues to circumduct LE with education and cues to correct movement pattern   Stairs Stairs: Yes Stairs assistance: Supervision Stair Management: One rail Left, Forwards Number of Stairs: 4     Wheelchair Mobility     Tilt Bed Tilt Bed Patient Response: Cooperative  Modified Rankin (Stroke Patients Only)       Balance Overall balance assessment: Modified Independent                                          Communication  Communication Communication: No apparent difficulties  Cognition Arousal: Alert Behavior During Therapy: WFL for tasks assessed/performed   PT - Cognitive impairments: No apparent impairments                         Following commands: Intact      Cueing Cueing Techniques: Verbal cues, Visual cues  Exercises Total Joint Exercises Goniometric ROM: 3-100 limited by bed rails Other Exercises Other Exercises: HEP review    General Comments        Pertinent Vitals/Pain Pain Assessment Pain Assessment: Faces Faces Pain Scale: Hurts a little bit Pain Location: HA (no L knee pain) Pain Descriptors / Indicators: Headache Pain Intervention(s): Monitored during session, Ice applied    Home Living                          Prior Function            PT Goals (current goals can now be found in the care plan section) Progress towards PT goals: Progressing toward goals    Frequency    BID      PT Plan      Co-evaluation              AM-PAC PT 6 Clicks Mobility   Outcome Measure  Help needed  turning from your back to your side while in a flat bed without using bedrails?: None Help needed moving from lying on your back to sitting on the side of a flat bed without using bedrails?: None Help needed moving to and from a bed to a chair (including a wheelchair)?: None Help needed standing up from a chair using your arms (e.g., wheelchair or bedside chair)?: None Help needed to walk in hospital room?: A Little Help needed climbing 3-5 steps with a railing? : A Little 6 Click Score: 22    End of Session Equipment Utilized During Treatment: Gait belt Activity Tolerance: Patient tolerated treatment well Patient left: in bed;with call bell/phone within reach Nurse Communication: Mobility status;Weight bearing status PT Visit Diagnosis: Muscle weakness (generalized) (M62.81);Other abnormalities of gait and mobility (R26.89)     Time: 9095-9079 PT  Time Calculation (min) (ACUTE ONLY): 16 min  Charges:    $Gait Training: 8-22 mins PT General Charges $$ ACUTE PT VISIT: 1 Visit                   Lauraine Gills, PTA 06/18/24, 9:34 AM '

## 2024-06-18 NOTE — TOC CM/SW Note (Signed)
 Patient is not able to walk the distance required to go the bathroom, or he/she is unable to safely negotiate stairs required to access the bathroom.  A 3in1 BSC will alleviate this problem

## 2024-06-18 NOTE — Anesthesia Postprocedure Evaluation (Signed)
"   Anesthesia Post Note  Patient: AYANNAH FADDIS  Procedure(s) Performed: ARTHROPLASTY, KNEE, TOTAL (Left: Knee)  Patient location during evaluation: Short Stay Anesthesia Type: Spinal Level of consciousness: oriented and awake and alert Pain management: pain level controlled Vital Signs Assessment: post-procedure vital signs reviewed and stable Respiratory status: spontaneous breathing and respiratory function stable Cardiovascular status: blood pressure returned to baseline and stable Postop Assessment: no headache, no backache and no apparent nausea or vomiting Anesthetic complications: no   No notable events documented.   Last Vitals:  Vitals:   06/18/24 0432 06/18/24 0550  BP: (!) 96/50   Pulse: 71 76  Resp: 18   Temp: (!) 36.4 C   SpO2:  97%    Last Pain:  Vitals:   06/18/24 0432  TempSrc: Temporal  PainSc:                  Alize Borrayo M. Kendel Pesnell      "

## 2024-06-18 NOTE — Progress Notes (Signed)
" °  Subjective: 1 Day Post-Op Procedures (LRB): ARTHROPLASTY, KNEE, TOTAL (Left) Patient reports pain as mild in the left knee. Patient is well but does report some headaches which are chronic for her. Plan is to go Home after hospital stay. Negative for chest pain and shortness of breath Fever: no Gastrointestinal:Positive for some nausea associated with her headache. Reports she is not passing much gas this morning but denies any abdominal pain.  Objective: Vital signs in last 24 hours: Temp:  [97 F (36.1 C)-97.7 F (36.5 C)] 97.5 F (36.4 C) (01/20 0432) Pulse Rate:  [63-79] 76 (01/20 0550) Resp:  [11-20] 18 (01/20 0432) BP: (96-128)/(50-80) 96/50 (01/20 0432) SpO2:  [93 %-100 %] 97 % (01/20 0550) Weight:  [80.3 kg] 80.3 kg (01/19 2310)  Intake/Output from previous day:  Intake/Output Summary (Last 24 hours) at 06/18/2024 0706 Last data filed at 06/18/2024 0340 Gross per 24 hour  Intake 2433.84 ml  Output 625 ml  Net 1808.84 ml    Intake/Output this shift: No intake/output data recorded.  Labs: Recent Labs    06/18/24 0534  HGB 11.3*   Recent Labs    06/18/24 0534  WBC 12.3*  RBC 3.62*  HCT 32.5*  PLT 232   Recent Labs    06/18/24 0534  NA 138  K 4.1  CL 106  CO2 23  BUN 10  CREATININE 0.70  GLUCOSE 139*  CALCIUM 8.4*   No results for input(s): LABPT, INR in the last 72 hours.   EXAM General - Patient is Alert, Appropriate, and Oriented Extremity - ABD soft Neurovascular intact Dorsiflexion/Plantar flexion intact Incision: moderate drainage No cellulitis present Compartment soft Dressing/Incision - Moderate bloody drainage noted to the left knee honeycomb dressing.  New honeycomb dressing applied.  ACE wrap re-applied. Motor Function - intact, moving foot and toes well on exam.  ABdomen soft with intact bowel sounds.  Past Medical History:  Diagnosis Date   Acute thoracic back pain    Asthma    Complication of anesthesia     Idiopathic urticaria    Swelling of knee joint     Assessment/Plan: 1 Day Post-Op Procedures (LRB): ARTHROPLASTY, KNEE, TOTAL (Left) Principal Problem:   S/P TKR (total knee replacement), left  Estimated body mass index is 29.45 kg/m as calculated from the following:   Height as of this encounter: 5' 5 (1.651 m).   Weight as of this encounter: 80.3 kg. Advance diet Up with therapy D/C IV fluids when tolerating po intake.  Labs and vitals reviewed this AM. WBC 12.3, Hg 11.3.  No fever. Headaches this AM, chronic for her.  Typically takes flexeril and meloxicam.  Can resume flexeril at home, celebrex  instead of Meloxicam prescribed. Up with therapy today. Continue to work on a BM. Plan for discharge home today with HHPT.  DVT Prophylaxis - Lovenox  and TED hose Weight-Bearing as tolerated to left leg  J. Gustavo Level, PA-C [facility] 06/18/2024, 7:06 AM  "
